# Patient Record
Sex: Female | Born: 1980 | Race: White | Hispanic: No | Marital: Married | Smoking: Never smoker
Health system: Southern US, Community
[De-identification: ages and names within clinical notes are randomized; demographics above are authoritative.]

## PROBLEM LIST (undated history)

## (undated) DIAGNOSIS — R569 Unspecified convulsions: Secondary | ICD-10-CM

## (undated) HISTORY — PX: TUBAL LIGATION: SHX77

## (undated) HISTORY — PX: CRANIOTOMY: SHX93

## (undated) HISTORY — DX: Unspecified convulsions: R56.9

## (undated) HISTORY — PX: HIP ARTHROSCOPY: SUR88

## (undated) HISTORY — PX: WISDOM TOOTH EXTRACTION: SHX21

---

## 2019-11-20 ENCOUNTER — Encounter: Payer: Self-pay | Admitting: Dermatology

## 2019-11-20 ENCOUNTER — Ambulatory Visit (INDEPENDENT_AMBULATORY_CARE_PROVIDER_SITE_OTHER): Payer: BC Managed Care – PPO | Admitting: Dermatology

## 2019-11-20 ENCOUNTER — Other Ambulatory Visit: Payer: Self-pay

## 2019-11-20 DIAGNOSIS — L659 Nonscarring hair loss, unspecified: Secondary | ICD-10-CM

## 2019-11-20 NOTE — Patient Instructions (Signed)
Alexandra Henderson returns for recheck of her hair loss.  Hair plug is now completely normal indicating that if there was telogen shedding it has self corrected.  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  She continues to use the minoxidil foam but the prescription phone became exorbitantly expensive so this was discontinued.  She is scheduled to see Dr. Lamount Cohen next month, and I will agreed to refill finasteride if Dr. Lamount Cohen prescribes this for Emy.  Additionally she has noticed a somewhat painful bump on her left lower back; examination showed a slightly swollen tan 3 mm papule which is likely an irritated keratosis.  I will recheck the spot when I do a general skin examination in the near future.

## 2019-11-20 NOTE — Progress Notes (Signed)
Patient reminded she has a visit date 12/19/2019 at 3:00with [facility] Dermatology Department With Dr Bernadene Bell.

## 2019-11-21 NOTE — Progress Notes (Signed)
   Follow-Up Visit   Subjective  Ceci Ravens is a 39 y.o. female who presents for the following: Follow-up (3 month follow up for Hair loss. Patient states that she's noticed that her hair isn't falling out as much.  Pt states that she did use the Clobetasol Foam for 1 month but had to stop due to the cost of the foam.  Patient has been using the OTC Rogaine.). Hair loss Location: scalp Duration: 1+ year Quality: a bit better Associated Signs/Symptoms: Modifying Factors: topical minoxidil; stopped clobetasol b/o cost Severity:  Timing: Context:   The following portions of the chart were reviewed this encounter and updated as appropriate:     Objective  Well appearing patient in no apparent distress; mood and affect are within normal limits.  A focused examination was performed including scalp, face, nails, neck. Relevant physical exam findings are noted in the Assessment and Plan. Berdene returns for recheck of her hair loss.  Hair plug is now completely normal indicating that if there was telogen shedding it has self corrected.  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  She continues to use the minoxidil foam but the prescription phone became exorbitantly expensive so this was discontinued.  She is scheduled to see Dr. Lamount Cohen next month, and I will agreed to refill finasteride if Dr. Lamount Cohen prescribes this for Lanisa.  Additionally she has noticed a somewhat painful bump on her left lower back; examination showed a slightly swollen tan 3 mm papule which is likely an irritated keratosis.  I will recheck the spot when I do a general skin examination in the near future.  Assessment & Plan  Nonscarring hair loss Mid Frontal Scalp  Continue minoxidil, keep appt with Dr. Lamount Cohen next month

## 2019-12-25 ENCOUNTER — Other Ambulatory Visit: Payer: Self-pay

## 2019-12-25 ENCOUNTER — Emergency Department (HOSPITAL_COMMUNITY): Payer: BC Managed Care – PPO

## 2019-12-25 ENCOUNTER — Ambulatory Visit: Payer: BC Managed Care – PPO | Admitting: Dermatology

## 2019-12-25 ENCOUNTER — Emergency Department (HOSPITAL_COMMUNITY)
Admission: EM | Admit: 2019-12-25 | Discharge: 2019-12-25 | Disposition: A | Discharge status: Home / Self Care | Payer: BC Managed Care – PPO | Attending: Emergency Medicine | Admitting: Emergency Medicine

## 2019-12-25 ENCOUNTER — Encounter (HOSPITAL_COMMUNITY): Payer: Self-pay

## 2019-12-25 DIAGNOSIS — R569 Unspecified convulsions: Secondary | ICD-10-CM | POA: Insufficient documentation

## 2019-12-25 DIAGNOSIS — D496 Neoplasm of unspecified behavior of brain: Secondary | ICD-10-CM

## 2019-12-25 DIAGNOSIS — R110 Nausea: Secondary | ICD-10-CM | POA: Diagnosis not present

## 2019-12-25 DIAGNOSIS — R519 Headache, unspecified: Secondary | ICD-10-CM | POA: Insufficient documentation

## 2019-12-25 LAB — COMPREHENSIVE METABOLIC PANEL
ALT: 30 U/L (ref 0–44)
AST: 25 U/L (ref 15–41)
Albumin: 3.3 g/dL — ABNORMAL LOW (ref 3.5–5.0)
Alkaline Phosphatase: 47 U/L (ref 38–126)
Anion gap: 11 (ref 5–15)
BUN: 16 mg/dL (ref 6–20)
CO2: 21 mmol/L — ABNORMAL LOW (ref 22–32)
Calcium: 8.5 mg/dL — ABNORMAL LOW (ref 8.9–10.3)
Chloride: 108 mmol/L (ref 98–111)
Creatinine, Ser: 0.72 mg/dL (ref 0.44–1.00)
GFR calc Af Amer: >60 mL/min (ref >60)
GFR calc non Af Amer: >60 mL/min (ref >60)
Glucose, Bld: 106 mg/dL — ABNORMAL HIGH (ref 70–99)
Potassium: 3.7 mmol/L (ref 3.5–5.1)
Sodium: 140 mmol/L (ref 135–145)
Total Bilirubin: 0.5 mg/dL (ref 0.3–1.2)
Total Protein: 6 g/dL — ABNORMAL LOW (ref 6.5–8.1)

## 2019-12-25 LAB — I-STAT BETA HCG BLOOD, ED (MC, WL, AP ONLY)
I-stat hCG, quantitative: <5 m[IU]/mL (ref <5)
I-stat hCG, quantitative: <5 m[IU]/mL (ref <5)

## 2019-12-25 LAB — URINALYSIS, ROUTINE W REFLEX MICROSCOPIC
Bilirubin Urine: NEGATIVE
Glucose, UA: NEGATIVE mg/dL
Hgb urine dipstick: NEGATIVE
Ketones, ur: NEGATIVE mg/dL
Leukocytes,Ua: NEGATIVE
Nitrite: NEGATIVE
Protein, ur: NEGATIVE mg/dL
Specific Gravity, Urine: 1.009 (ref 1.005–1.030)
pH: 7 (ref 5.0–8.0)

## 2019-12-25 LAB — CBC
HCT: 37.5 % (ref 36.0–46.0)
Hemoglobin: 12.1 g/dL (ref 12.0–15.0)
MCH: 30.3 pg (ref 26.0–34.0)
MCHC: 32.3 g/dL (ref 30.0–36.0)
MCV: 93.8 fL (ref 80.0–100.0)
Platelets: 189 10*3/uL (ref 150–400)
RBC: 4 MIL/uL (ref 3.87–5.11)
RDW: 12 % (ref 11.5–15.5)
WBC: 6.2 10*3/uL (ref 4.0–10.5)
nRBC: 0 % (ref 0.0–0.2)

## 2019-12-25 LAB — CBG MONITORING, ED: Glucose-Capillary: 92 mg/dL (ref 70–99)

## 2019-12-25 MED ORDER — LAMOTRIGINE 150 MG PO TABS
150.0000 mg | ORAL_TABLET | Freq: Once | ORAL | Status: AC
  Administered 2019-12-25: 07:00:00 150 mg via ORAL
  Filled 2019-12-25: qty 1

## 2019-12-25 MED ORDER — LAMOTRIGINE 150 MG PO TABS: 150.0000 mg | ORAL_TABLET | Freq: Every day | ORAL | 1 refills | Status: DC

## 2019-12-25 MED ORDER — ONDANSETRON HCL 4 MG/2ML IJ SOLN
4.0000 mg | Freq: Once | INTRAMUSCULAR | Status: AC
  Administered 2019-12-25: 07:00:00 4 mg via INTRAVENOUS
  Filled 2019-12-25: qty 2

## 2019-12-25 MED ORDER — SODIUM CHLORIDE 0.9 % IV BOLUS
1000.0000 mL | Freq: Once | INTRAVENOUS | Status: AC
  Administered 2019-12-25: 1000 mL via INTRAVENOUS

## 2019-12-25 MED ORDER — LEVETIRACETAM IN NACL 1000 MG/100ML IV SOLN
1000.0000 mg | Freq: Once | INTRAVENOUS | Status: DC
  Filled 2019-12-25: qty 100

## 2019-12-25 MED ORDER — GADOBUTROL 1 MMOL/ML IV SOLN
7.0000 mL | Freq: Once | INTRAVENOUS | Status: AC | PRN
  Administered 2019-12-25: 7 mL via INTRAVENOUS

## 2019-12-25 NOTE — Consult Note (Signed)
Reason for Consult:Brain mass, seizure Referring Physician: [name] is an 39 y.o. female.  HPI: whom seized this am and found on CT to have a lesion in the left frontal area.  History reviewed. No pertinent past medical history.  History reviewed. No pertinent surgical history.  History reviewed. No pertinent family history.  Social History:  reports that she has never smoked. She has never used smokeless tobacco. She reports previous alcohol use. She reports that she does not use drugs.  Allergies:  Allergies  Allergen Reactions  . Penicillins Hives    Medications: I have reviewed the patient's current medications.  Results for orders placed or performed during the hospital encounter of 12/25/19 (from the past 48 hour(s))  CBC - if new onset seizures     Status: None   Collection Time: 12/25/19  4:28 AM  Result Value Ref Range   WBC 6.2 4.0 - 10.5 K/uL   RBC 4.00 3.87 - 5.11 MIL/uL   Hemoglobin 12.1 12.0 - 15.0 g/dL   HCT 37.5 36.0 - 46.0 %   MCV 93.8 80.0 - 100.0 fL   MCH 30.3 26.0 - 34.0 pg   MCHC 32.3 30.0 - 36.0 g/dL   RDW 12.0 11.5 - 15.5 %   Platelets 189 150 - 400 K/uL   nRBC 0.0 0.0 - 0.2 %    Comment: Performed at [facility], [place] [address], [place], [place] [address]  Comprehensive metabolic panel     Status: Abnormal   Collection Time: 12/25/19  4:28 AM  Result Value Ref Range   Sodium 140 135 - 145 mmol/L   Potassium 3.7 3.5 - 5.1 mmol/L   Chloride 108 98 - 111 mmol/L   CO2 21 (L) 22 - 32 mmol/L   Glucose, Bld 106 (H) 70 - 99 mg/dL    Comment: Glucose reference range applies only to samples taken after fasting for at least 8 hours.   BUN 16 6 - 20 mg/dL   Creatinine, Ser 0.72 0.44 - 1.00 mg/dL   Calcium 8.5 (L) 8.9 - 10.3 mg/dL   Total Protein 6.0 (L) 6.5 - 8.1 g/dL   Albumin 3.3 (L) 3.5 - 5.0 g/dL   AST 25 15 - 41 U/L   ALT 30 0 - 44 U/L   Alkaline Phosphatase 47 38 - 126 U/L   Total Bilirubin 0.5 0.3 - 1.2 mg/dL   GFR calc  non Af Amer >60 >60 mL/min   GFR calc Af Amer >60 >60 mL/min   Anion gap 11 5 - 15    Comment: Performed at [facility] [address], [place], [place] [address]  CBG monitoring, ED     Status: None   Collection Time: 12/25/19  4:33 AM  Result Value Ref Range   Glucose-Capillary 92 70 - 99 mg/dL    Comment: Glucose reference range applies only to samples taken after fasting for at least 8 hours.  I-Stat beta hCG blood, ED     Status: None   Collection Time: 12/25/19  4:45 AM  Result Value Ref Range   I-stat hCG, quantitative <5.0 <5 mIU/mL   Comment 3            Comment:   GEST. AGE      CONC.  (mIU/mL)   <=1 WEEK        5 - 50     2 WEEKS       50 - 500  3 WEEKS       100 - 10,000     4 WEEKS     1,000 - 30,000        FEMALE AND NON-PREGNANT FEMALE:     LESS THAN 5 mIU/mL   I-Stat beta hCG blood, ED     Status: None   Collection Time: 12/25/19  7:12 AM  Result Value Ref Range   I-stat hCG, quantitative <5.0 <5 mIU/mL   Comment 3            Comment:   GEST. AGE      CONC.  (mIU/mL)   <=1 WEEK        5 - 50     2 WEEKS       50 - 500     3 WEEKS       100 - 10,000     4 WEEKS     1,000 - 30,000        FEMALE AND NON-PREGNANT FEMALE:     LESS THAN 5 mIU/mL   Urinalysis, Routine w reflex microscopic     Status: Abnormal   Collection Time: 12/25/19  9:00 AM  Result Value Ref Range   Color, Urine STRAW (A) YELLOW   APPearance CLEAR CLEAR   Specific Gravity, Urine 1.009 1.005 - 1.030   pH 7.0 5.0 - 8.0   Glucose, UA NEGATIVE NEGATIVE mg/dL   Hgb urine dipstick NEGATIVE NEGATIVE   Bilirubin Urine NEGATIVE NEGATIVE   Ketones, ur NEGATIVE NEGATIVE mg/dL   Protein, ur NEGATIVE NEGATIVE mg/dL   Nitrite NEGATIVE NEGATIVE   Leukocytes,Ua NEGATIVE NEGATIVE    Comment: Performed at [facility], [place] [address] [place],  [address]    CT Head Wo Contrast  Result Date: 12/25/2019 CLINICAL DATA:  Seizure EXAM: CT HEAD WITHOUT CONTRAST TECHNIQUE: Contiguous  axial images were obtained from the base of the skull through the vertex without intravenous contrast. COMPARISON:  Seizure, abnormal neuro exam FINDINGS: Brain: There is a large hypodense area seen within the left frontal lobe with a rounded coarse calcifications seen within it. No midline shift or extra-axial collections are seen. Normal gray-white differentiation. Ventricles are normal in size and contour. Vascular: No hyperdense vessel or unexpected calcification. Skull: The skull is intact. No fracture or focal lesion identified. Sinuses/Orbits: The visualized paranasal sinuses and mastoid air cells are clear. The orbits and globes intact. Other: None IMPRESSION: Large left frontal lobe lesion with coarse internal calcifications which could be due to prior infection, infectious process, and/or neoplasm. Electronically Signed   By: Prudencio Pair M.D.   On: 12/25/2019 05:15   MR Brain W and Wo Contrast  Result Date: 12/25/2019 CLINICAL DATA:  Seizure with abnormal neuro exam EXAM: MRI HEAD WITHOUT AND WITH CONTRAST TECHNIQUE: Multiplanar, multiecho pulse sequences of the brain and surrounding structures were obtained without and with intravenous contrast. CONTRAST:  11mL GADAVIST GADOBUTROL 1 MMOL/ML IV SOLN COMPARISON:  Head CT earlier today FINDINGS: Brain: Heterogeneous mass in the left frontal lobe which has an intra-axial appearance posteriorly and is bulging from the cortex anteriorly. There are prominent superficial vessels, dystrophic appearing calcification, and patchy stippled enhancement. This mass measures up to 5 cm. There is a separate area of infiltrative cortically based T2 hyperintensity with gyral thickening in the frontal pole on the right, measuring up to 2.4 cm, without enhancement. No intervening signal abnormality. Elsewhere the brain appears normal with no infarct, hemorrhage, hydrocephalus, or collection. Vascular: Normal flow  voids and vascular enhancements Skull and upper cervical  spine: Negative Sinuses/Orbits: Negative IMPRESSION: 1. 5 cm anterior left frontal mass with calcification and stippled enhancement. Neoplasm, especially oligodendroglioma, is favored. 2. There is a smaller area of infiltrative, nonenhancing signal abnormality in the anterior right frontal lobe. 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 Electronically Signed   By: Monte Fantasia M.D.   On: 12/25/2019 08:18    Review of Systems  Constitutional: Negative.   HENT: Negative.   Eyes: Negative.   Respiratory: Negative.   Cardiovascular: Negative.   Gastrointestinal: Negative.   Endocrine: Negative.   Genitourinary: Negative.   Musculoskeletal: Negative.   Skin: Negative.   Allergic/Immunologic: Negative.   Neurological: Positive for speech difficulty.  Hematological: Negative.   Psychiatric/Behavioral:       Anxiety   Blood pressure 98/66, pulse 74, temperature 97.9 F (36.6 C), temperature source Oral, resp. rate 16, SpO2 98 %. Physical Exam  Constitutional: She is oriented to person, place, and time. She appears well-developed and well-nourished. No distress.  HENT:  Head: Normocephalic and atraumatic.  Right Ear: External ear normal.  Left Ear: External ear normal.  Nose: Nose normal.  Mouth/Throat: Oropharynx is clear and moist.  Eyes: Pupils are equal, round, and reactive to light. Conjunctivae and EOM are normal.  Cardiovascular: Normal rate, regular rhythm, normal heart sounds and intact distal pulses.  Respiratory: Effort normal and breath sounds normal.  GI: Soft. Bowel sounds are normal.  Musculoskeletal:        General: Normal range of motion.     Cervical back: Normal range of motion and neck supple.  Neurological: She is alert and oriented to person, place, and time. She has normal reflexes. She displays normal reflexes. No cranial nerve deficit. She exhibits normal muscle tone.  Coordination normal.  No drift, normal proprioception  Skin: Skin is warm and dry.  Psychiatric: She has a normal mood and affect. Her behavior is normal. Judgment and thought content normal.    Assessment/Plan: Alexandra Henderson is a 39 y.o. female Whom had her first seizure this am approximately 0231 while asleep. Head CT and subsequent MRI revealed a probable extra axial mass poorly enhancing heavily calcified in the left frontal region. She also has a T2 hyperdense gyrus in right frontal lobe. The seizure may have been because she had not taken her Lamictal for three days. I however, given the mass on the left have recommended resection of the mass. I will plan on next week and have spoken to Mrs. Frazzini and her husband in the ED today. Risks including but not limited to stroke, coma, death, bleeding, infection, brain damage, change in personality, change in speech function, weakness, non diagnostic sampling. She understands and will wait for my office to contact her.   Ashok Pall 12/25/2019, 10:22 AM

## 2019-12-25 NOTE — ED Notes (Signed)
Pt transported to CT ?

## 2019-12-25 NOTE — ED Provider Notes (Signed)
[facility] EMERGENCY DEPARTMENT Provider Note   CSN: [id number] Arrival date & time: 12/25/19  [id number]     History Chief Complaint  Patient presents with  . Seizures    Alexandra Henderson is a 39 y.o. female with history of anxiety who presents to the emergency department by EMS with a chief complaint of seizure.  The patient had a witnessed seizure by her significant other with a full body shaking and jerking that lasted for approximately 1-2 minutes prior to arriving.  Seizure activity has subsided by the time the EMS was on scene.  Trauma was noted to the tip in the bilateral sides of the patient's tongue.  The patient was noted to be drowsy during transport.  EMS reports that the patient's blood pressure was 90 over 60s in route with minimal improvement after receiving a 750 cc fluid bolus.  The patient's husband reports that he awoke to the patient having full body shaking and jerking with her bilateral arms clenched.  He notes that the seizure lasted for approximately 1 minute.  She seemed very drowsy and had decreased responsiveness for the next 10 to 15 minutes after the episode ended.  She did not fall out of bed or hit her head.  He notes that earlier in the evening the patient was complaining of a severe headache.  In the ER, she is endorsing a left-sided headache and nausea.  Her left leg is feeling weak and heavy.  No chest pain, shortness of breath, visual changes, slurred speech, numbness, dizziness, or lightheadedness.  The patient has been taking Lamictal for anxiety for several months, but has been out of the medication for the last 3 days.  She has been compliant with her home Lexapro.  No history of seizures.  She is a non-smoker.  She denies illicit or recreational substance use.  Denies alcohol use.  The history is provided by the patient. No language interpreter was used.       History reviewed. No pertinent past medical history.  There are no  problems to display for this patient.   History reviewed. No pertinent surgical history.   OB History   No obstetric history on file.     History reviewed. No pertinent family history.  Social History   Tobacco Use  . Smoking status: Never Smoker  . Smokeless tobacco: Never Used  Substance Use Topics  . Alcohol use: Not Currently  . Drug use: Never    Home Medications Prior to Admission medications   Medication Sig Start Date End Date Taking? Authorizing Provider  escitalopram (LEXAPRO) 20 MG tablet Take 20 mg by mouth daily. 11/05/19  Yes [provider]  Multiple Vitamin (MULTIVITAMIN WITH MINERALS) TABS tablet Take 1 tablet by mouth daily.   Yes [provider]  lamoTRIgine (LAMICTAL) 150 MG tablet Take 1 tablet (150 mg total) by mouth daily. 12/25/19   Aashritha Miedema A, PA-C    Allergies    Penicillins  Review of Systems   Review of Systems  Constitutional: Negative for activity change, chills, diaphoresis and fever.  HENT: Negative for congestion.   Respiratory: Negative for shortness of breath.   Cardiovascular: Negative for chest pain and palpitations.  Gastrointestinal: Positive for nausea. Negative for abdominal pain, blood in stool, diarrhea and vomiting.  Genitourinary: Negative for dysuria, vaginal bleeding, vaginal discharge and vaginal pain.  Musculoskeletal: Negative for back pain, myalgias, neck pain and neck stiffness.  Skin: Negative for rash.  Allergic/Immunologic: Negative for immunocompromised  state.  Neurological: Positive for seizures, weakness and headaches. Negative for dizziness, syncope, light-headedness and numbness.  Psychiatric/Behavioral: Negative for confusion.   Physical Exam Updated Vital Signs BP 97/64   Pulse 62   Temp 97.9 F (36.6 C) (Oral)   Resp 14   SpO2 98%   Physical Exam Vitals and nursing note reviewed.  Constitutional:      General: She is not in acute distress. HENT:     Head: Normocephalic.      Mouth/Throat:      Comments: Superficial abrasions noted to the tip into the bilateral anterior tongue.  No lacerations. Eyes:     Extraocular Movements: Extraocular movements intact.     Conjunctiva/sclera: Conjunctivae normal.     Pupils: Pupils are equal, round, and reactive to light.  Cardiovascular:     Rate and Rhythm: Normal rate and regular rhythm.     Heart sounds: No murmur. No friction rub. No gallop.   Pulmonary:     Effort: Pulmonary effort is normal. No respiratory distress.     Breath sounds: No stridor. No wheezing, rhonchi or rales.  Chest:     Chest wall: No tenderness.  Abdominal:     General: There is no distension.     Palpations: Abdomen is soft. There is no mass.     Tenderness: There is no abdominal tenderness. There is no right CVA tenderness, left CVA tenderness, guarding or rebound.     Hernia: No hernia is present.  Musculoskeletal:     Cervical back: Neck supple.     Right lower leg: No edema.     Left lower leg: No edema.  Skin:    General: Skin is warm.     Coloration: Skin is not jaundiced or pale.     Findings: No rash.  Neurological:     Mental Status: She is alert.     Comments: Alert and oriented x4.  Cranial nerves II through XII are grossly intact.  4 out of 5 strength against resistance of the left lower extremity compared to the right.  5/5 strength against resistance of the bilateral upper extremities.  Sensation is intact and equal throughout.  Dysmetria noted bilaterally.  No pronator drift.  Gait exam deferred at this time.  Psychiatric:        Behavior: Behavior normal.     ED Results / Procedures / Treatments   Labs (all labs ordered are listed, but only abnormal results are displayed) Labs Reviewed  COMPREHENSIVE METABOLIC PANEL - Abnormal; Notable for the following components:      Result Value   CO2 21 (*)    Glucose, Bld 106 (*)    Calcium 8.5 (*)    Total Protein 6.0 (*)    Albumin 3.3 (*)    All other components within  normal limits  CBC  LAMOTRIGINE LEVEL  URINALYSIS, ROUTINE W REFLEX MICROSCOPIC  CBG MONITORING, ED  I-STAT BETA HCG BLOOD, ED ([facility], [facility], [facility] ONLY)  I-STAT BETA HCG BLOOD, ED ([facility], [facility], [facility] ONLY)    EKG EKG Interpretation  Date/Time:  Wednesday December 25 2019 04:24:22 EDT Ventricular Rate:  67 PR Interval:    QRS Duration: 88 QT Interval:  423 QTC Calculation: 447 R Axis:   52 Text Interpretation: Sinus rhythm Normal ECG Confirmed by Orpah Greek [phone]) on 12/25/2019 4:29:22 AM   Radiology CT Head Wo Contrast  Result Date: 12/25/2019 CLINICAL DATA:  Seizure EXAM: CT HEAD WITHOUT CONTRAST TECHNIQUE: Contiguous axial images were  obtained from the base of the skull through the vertex without intravenous contrast. COMPARISON:  Seizure, abnormal neuro exam FINDINGS: Brain: There is a large hypodense area seen within the left frontal lobe with a rounded coarse calcifications seen within it. No midline shift or extra-axial collections are seen. Normal gray-white differentiation. Ventricles are normal in size and contour. Vascular: No hyperdense vessel or unexpected calcification. Skull: The skull is intact. No fracture or focal lesion identified. Sinuses/Orbits: The visualized paranasal sinuses and mastoid air cells are clear. The orbits and globes intact. Other: None IMPRESSION: Large left frontal lobe lesion with coarse internal calcifications which could be due to prior infection, infectious process, and/or neoplasm. Electronically Signed   By: Prudencio Pair M.D.   On: 12/25/2019 05:15    Procedures Procedures (including critical care time)  Medications Ordered in ED Medications  lamoTRIgine (LAMICTAL) tablet 150 mg (has no administration in time range)  sodium chloride 0.9 % bolus 1,000 mL (1,000 mLs Intravenous Bolus from Bag 12/25/19 0710)  ondansetron (ZOFRAN) injection 4 mg (4 mg Intravenous Given 12/25/19 0710)    ED Course  I have reviewed the triage vital signs and the  nursing notes.  Pertinent labs & imaging results that were available during my care of the patient were reviewed by me and considered in my medical decision making (see chart for details).    MDM Rules/Calculators/A&P                      39 year old female brought in by EMS after witnessed seizure-like activity lasted for approximately 85minute and a night.  Patient has had a persistent severe left-sided headache for most of the evening and is now endorsing nausea.  The patient has been on Lamictal for several months and has not taken the medication at home for the last 3 days.  Suspect this may be contributing to seizure tonight.  The patient has no history of seizure disorder or epilepsy.  She denies any illicit substance use.  No recent alcohol use.  On exam, she does have some left leg weakness.  Sensation is intact throughout.  Cranial nerves II through XII are grossly intact.   No significant electrolyte derangements noted on labs.  EKG with normal sinus rhythm.  Pregnancy test is negative.  CT head demonstrating a large left frontal lobe lesion with coarse internal calcifications.  This appears to be chronic and is concerning for prior infection, infectious process, and/or neoplasm.  Discussed the patient with Dr. Leonel Ramsay.  He has seen cases in which patients have been started on Lamictal for other reasons aside from epilepsy and have had withdrawal seizures if the medication has been stopped.  He recommends continuing the patient on Lamictal and giving her 150 mg dose in the ER.  We also discussed the lesion noted on head CT.  He recommends MR brain with and without for further evaluation of the lesion to assist with outpatient follow-up.  MRI brain is pending.  On repeat evaluation, the patient is out of her home Lamictal.  We will provide her with a refill of the medication.  She is continuing to endorse some mild heaviness in the left leg, but her strength is now 5-5 to the bilateral  lower extremities, improved from previous.  She will need to be ambulated prior to discharge.  She will need to be given seizure and driving precautions for 6 months.  Patient care transferred to [facility] at the end of my shift.  Patient presentation, ED course, and plan of care discussed with review of all pertinent labs and imaging. Please see his/her note for further details regarding further ED course and disposition.   Final Clinical Impression(s) / ED Diagnoses Final diagnoses:  None    Rx / DC Orders ED Discharge Orders         Ordered    lamoTRIgine (LAMICTAL) 150 MG tablet  Daily     12/25/19 0709           Joanne Gavel, PA-C 12/25/19 [id number]    Orpah Greek, MD 12/29/19 [phone]

## 2019-12-25 NOTE — Discharge Instructions (Addendum)
Thank you for allowing me to care for you today in the Emergency Department.   I have given you a refill of your home Lamictal.  Since you had a seizure today, no driving for 6 months.  You need to be cleared by neurology before being allowed to operate a motor vehicle again.  Call to schedule follow up with Dr. Christella Noa   Return to the ED if you have any additional seizures

## 2019-12-25 NOTE — ED Triage Notes (Signed)
Pt BIB [facility] from home c/o of a witness 1-2 min granmal seizure. Pt has obvious tongue trauma to the tip of the tongue and both sides. Pt states the only change is that she has been out of her Lamictal for 3 days. Pt states she was also outside working today as well. Pt states she does not remember what happened but does not have a history of seizure and is a pretty healthy lady.

## 2019-12-25 NOTE — ED Notes (Signed)
Patient Alert and oriented to baseline. Stable and ambulatory to baseline. Patient verbalized understanding of the discharge instructions.  Patient belongings were taken by the patient.   

## 2019-12-25 NOTE — ED Provider Notes (Signed)
Care assumed from [place], please see her note for full details, but in brief Alexandra Henderson is a 39 y.o. female with a history of anxiety who presented with first-time witnessed seizure that lasted 1 to 2 minutes.  Seizure had resolved by the time EMS arrived patient was postictal for the next 10 to 15 minutes.  She was complaining of a severe headache earlier that evening.  And reports continued left-sided headache, nausea and feeling of left leg heaviness and weakness on arrival.  No further seizure activity while monitored here in the ED. 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  Case was discussed with Dr. Leonel Ramsay with neurology who has seen withdrawal seizures in people who abruptly stop Lamictal for mood stabilization.  CT of the head also demonstrates a large left frontal lobe mass with coarse internal calcifications.  Radiology reports this is concerning for potential infection versus neoplasm.  Dr. Leonel Ramsay with neurology recommended MRI for better characterization.  Patient given loading Keppra dose and will need to be restarted on her home Keppra.  Plan: Follow-up on MRI results, reassess left lower extremity weakness, patient will need to ambulate, anticipate discharge with outpatient follow-up.  BP 97/64   Pulse 62   Temp 97.9 F (36.6 C) (Oral)   Resp 14   SpO2 98%    ED Course/Procedures   Labs Reviewed  COMPREHENSIVE METABOLIC PANEL - Abnormal; Notable for the following components:      Result Value   CO2 21 (*)    Glucose, Bld 106 (*)    Calcium 8.5 (*)    Total Protein 6.0 (*)    Albumin 3.3 (*)    All other components within normal limits  CBC  LAMOTRIGINE LEVEL  URINALYSIS, ROUTINE W REFLEX MICROSCOPIC  CBG MONITORING, ED  I-STAT BETA HCG BLOOD, ED ([facility], [facility], [facility] ONLY)  I-STAT BETA HCG BLOOD, ED ([facility], [facility], [facility] ONLY)   CT Head Wo Contrast  Result Date: 12/25/2019 CLINICAL DATA:   Seizure EXAM: CT HEAD WITHOUT CONTRAST TECHNIQUE: Contiguous axial images were obtained from the base of the skull through the vertex without intravenous contrast. COMPARISON:  Seizure, abnormal neuro exam FINDINGS: Brain: There is a large hypodense area seen within the left frontal lobe with a rounded coarse calcifications seen within it. No midline shift or extra-axial collections are seen. Normal gray-white differentiation. Ventricles are normal in size and contour. Vascular: No hyperdense vessel or unexpected calcification. Skull: The skull is intact. No fracture or focal lesion identified. Sinuses/Orbits: The visualized paranasal sinuses and mastoid air cells are clear. The orbits and globes intact. Other: None IMPRESSION: Large left frontal lobe lesion with coarse internal calcifications which could be due to prior infection, infectious process, and/or neoplasm. Electronically Signed   By: Prudencio Pair M.D.   On: 12/25/2019 05:15   MR Brain W and Wo Contrast  Result Date: 12/25/2019 CLINICAL DATA:  Seizure with abnormal neuro exam EXAM: MRI HEAD WITHOUT AND WITH CONTRAST TECHNIQUE: Multiplanar, multiecho pulse sequences of the brain and surrounding structures were obtained without and with intravenous contrast. CONTRAST:  48mL GADAVIST GADOBUTROL 1 MMOL/ML IV SOLN COMPARISON:  Head CT earlier today FINDINGS: Brain: Heterogeneous mass in the left frontal lobe which has an intra-axial appearance posteriorly and is bulging from the cortex anteriorly. There are prominent superficial vessels, dystrophic appearing calcification, and patchy stippled enhancement. This mass measures up to 5 cm. There  is a separate area of infiltrative cortically based T2 hyperintensity with gyral thickening in the frontal pole on the right, measuring up to 2.4 cm, without enhancement. No intervening signal abnormality. Elsewhere the brain appears normal with no infarct, hemorrhage, hydrocephalus, or collection. Vascular: Normal flow  voids and vascular enhancements Skull and upper cervical spine: Negative Sinuses/Orbits: Negative IMPRESSION: 1. 5 cm anterior left frontal mass with calcification and stippled enhancement. Neoplasm, especially oligodendroglioma, is favored. 2. There is a smaller area of infiltrative, nonenhancing signal abnormality in the anterior right frontal lobe. 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 Electronically Signed   By: Monte Fantasia M.D.   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  I discussed these results with the patient and her husband.  Will consult neurosurgery for further recommendations.  Case discussed with Dr. Christella Noa with neurosurgery who will come to see the patient in the ED shortly.  Dr. Christella Noa has seen and evaluated patient in the ED, feel she is stable for discharge and outpatient follow-up, with plans to schedule surgery for tumor removal.  Neurology did not feel that patient needed additional seizure medication at this time and she has been monitored here in the ED for 6 hours with no further seizure activity.  She has had complete resolution of left lower extremity weakness and has been able to ambulate here in the ED.  I discussed strict return precautions for the patient and gave her seizure precautions, no driving for 6 months.  Patient and husband expressed understanding and agreement with plan.  Discharged home in good condition.  Final diagnoses:  Seizure [facility])  Brain tumor [facility])        Jacqlyn Larsen, PA-C 12/25/19 1022    Quintella Reichert, MD 12/26/19 [phone]

## 2019-12-25 NOTE — ED Notes (Signed)
Pt ambulatory with no assistance with steady gait. Pt reports no left leg weakness/numbness while ambulating. Pt resting in bed at this time.

## 2019-12-25 NOTE — ED Notes (Signed)
Pt transported to MRI 

## 2019-12-26 ENCOUNTER — Telehealth: Payer: Self-pay | Admitting: *Deleted

## 2019-12-26 ENCOUNTER — Other Ambulatory Visit: Payer: Self-pay | Admitting: Radiation Therapy

## 2019-12-26 DIAGNOSIS — G939 Disorder of brain, unspecified: Secondary | ICD-10-CM | POA: Insufficient documentation

## 2019-12-26 LAB — LAMOTRIGINE LEVEL: Lamotrigine Lvl: <1 ug/mL — ABNORMAL LOW (ref 2.0–20.0)

## 2019-12-26 NOTE — Telephone Encounter (Signed)
Per referral attempted to contact patient to get scheduled to see Dr. Mickeal Skinner.  Patient stated that they had an appt today at [facility] and felt like they would seek care there.  Offered assistance in any way possible if that should change.

## 2019-12-30 ENCOUNTER — Inpatient Hospital Stay: Payer: BC Managed Care – PPO | Attending: Internal Medicine

## 2019-12-30 ENCOUNTER — Telehealth: Payer: Self-pay | Admitting: Internal Medicine

## 2019-12-30 NOTE — Telephone Encounter (Signed)
Received a new referral from the ED for seizures. I cld and left the pt a voicemail to call back and schedule an appt w/Dr. Mickeal Skinner.

## 2020-01-08 DIAGNOSIS — C719 Malignant neoplasm of brain, unspecified: Secondary | ICD-10-CM | POA: Insufficient documentation

## 2020-04-20 DIAGNOSIS — C711 Malignant neoplasm of frontal lobe: Secondary | ICD-10-CM | POA: Diagnosis not present

## 2020-04-20 DIAGNOSIS — G9389 Other specified disorders of brain: Secondary | ICD-10-CM | POA: Diagnosis not present

## 2020-04-20 DIAGNOSIS — Z9889 Other specified postprocedural states: Secondary | ICD-10-CM | POA: Diagnosis not present

## 2020-04-20 DIAGNOSIS — Z923 Personal history of irradiation: Secondary | ICD-10-CM | POA: Diagnosis not present

## 2020-04-24 DIAGNOSIS — C711 Malignant neoplasm of frontal lobe: Secondary | ICD-10-CM | POA: Diagnosis not present

## 2020-04-24 DIAGNOSIS — Z79899 Other long term (current) drug therapy: Secondary | ICD-10-CM | POA: Diagnosis not present

## 2020-04-24 DIAGNOSIS — C719 Malignant neoplasm of brain, unspecified: Secondary | ICD-10-CM | POA: Diagnosis not present

## 2020-05-18 DIAGNOSIS — Z5111 Encounter for antineoplastic chemotherapy: Secondary | ICD-10-CM | POA: Diagnosis not present

## 2020-05-18 DIAGNOSIS — C711 Malignant neoplasm of frontal lobe: Secondary | ICD-10-CM | POA: Diagnosis not present

## 2020-05-27 DIAGNOSIS — C719 Malignant neoplasm of brain, unspecified: Secondary | ICD-10-CM | POA: Diagnosis not present

## 2020-06-02 DIAGNOSIS — C719 Malignant neoplasm of brain, unspecified: Secondary | ICD-10-CM | POA: Diagnosis not present

## 2020-06-08 DIAGNOSIS — Z5111 Encounter for antineoplastic chemotherapy: Secondary | ICD-10-CM | POA: Diagnosis not present

## 2020-06-08 DIAGNOSIS — Z79899 Other long term (current) drug therapy: Secondary | ICD-10-CM | POA: Diagnosis not present

## 2020-06-08 DIAGNOSIS — C711 Malignant neoplasm of frontal lobe: Secondary | ICD-10-CM | POA: Diagnosis not present

## 2020-06-17 DIAGNOSIS — Z79899 Other long term (current) drug therapy: Secondary | ICD-10-CM | POA: Diagnosis not present

## 2020-06-17 DIAGNOSIS — Z9221 Personal history of antineoplastic chemotherapy: Secondary | ICD-10-CM | POA: Diagnosis not present

## 2020-06-17 DIAGNOSIS — R569 Unspecified convulsions: Secondary | ICD-10-CM | POA: Diagnosis not present

## 2020-06-17 DIAGNOSIS — C711 Malignant neoplasm of frontal lobe: Secondary | ICD-10-CM | POA: Diagnosis not present

## 2020-06-17 DIAGNOSIS — Z923 Personal history of irradiation: Secondary | ICD-10-CM | POA: Diagnosis not present

## 2020-06-22 DIAGNOSIS — C719 Malignant neoplasm of brain, unspecified: Secondary | ICD-10-CM | POA: Diagnosis not present

## 2020-06-22 DIAGNOSIS — Z79899 Other long term (current) drug therapy: Secondary | ICD-10-CM | POA: Diagnosis not present

## 2020-06-29 DIAGNOSIS — C711 Malignant neoplasm of frontal lobe: Secondary | ICD-10-CM | POA: Diagnosis not present

## 2020-06-29 DIAGNOSIS — Z5111 Encounter for antineoplastic chemotherapy: Secondary | ICD-10-CM | POA: Diagnosis not present

## 2020-07-10 DIAGNOSIS — C719 Malignant neoplasm of brain, unspecified: Secondary | ICD-10-CM | POA: Diagnosis not present

## 2020-07-17 DIAGNOSIS — C719 Malignant neoplasm of brain, unspecified: Secondary | ICD-10-CM | POA: Diagnosis not present

## 2020-07-20 DIAGNOSIS — C711 Malignant neoplasm of frontal lobe: Secondary | ICD-10-CM | POA: Diagnosis not present

## 2020-07-20 DIAGNOSIS — Z923 Personal history of irradiation: Secondary | ICD-10-CM | POA: Diagnosis not present

## 2020-07-20 DIAGNOSIS — Z5111 Encounter for antineoplastic chemotherapy: Secondary | ICD-10-CM | POA: Diagnosis not present

## 2020-07-20 DIAGNOSIS — Z79899 Other long term (current) drug therapy: Secondary | ICD-10-CM | POA: Diagnosis not present

## 2020-07-29 DIAGNOSIS — R519 Headache, unspecified: Secondary | ICD-10-CM | POA: Diagnosis not present

## 2020-07-29 DIAGNOSIS — C719 Malignant neoplasm of brain, unspecified: Secondary | ICD-10-CM | POA: Diagnosis not present

## 2020-07-29 DIAGNOSIS — K5900 Constipation, unspecified: Secondary | ICD-10-CM | POA: Diagnosis not present

## 2020-07-29 DIAGNOSIS — Z9889 Other specified postprocedural states: Secondary | ICD-10-CM | POA: Diagnosis not present

## 2020-07-29 DIAGNOSIS — Z95828 Presence of other vascular implants and grafts: Secondary | ICD-10-CM | POA: Diagnosis not present

## 2020-07-29 DIAGNOSIS — C711 Malignant neoplasm of frontal lobe: Secondary | ICD-10-CM | POA: Diagnosis not present

## 2020-08-03 DIAGNOSIS — C719 Malignant neoplasm of brain, unspecified: Secondary | ICD-10-CM | POA: Diagnosis not present

## 2020-08-03 DIAGNOSIS — Z5111 Encounter for antineoplastic chemotherapy: Secondary | ICD-10-CM | POA: Diagnosis not present

## 2020-08-05 DIAGNOSIS — C719 Malignant neoplasm of brain, unspecified: Secondary | ICD-10-CM | POA: Diagnosis not present

## 2020-08-12 DIAGNOSIS — Z5111 Encounter for antineoplastic chemotherapy: Secondary | ICD-10-CM | POA: Diagnosis not present

## 2020-08-12 DIAGNOSIS — C711 Malignant neoplasm of frontal lobe: Secondary | ICD-10-CM | POA: Diagnosis not present

## 2020-08-12 DIAGNOSIS — Z79899 Other long term (current) drug therapy: Secondary | ICD-10-CM | POA: Diagnosis not present

## 2020-08-17 DIAGNOSIS — C719 Malignant neoplasm of brain, unspecified: Secondary | ICD-10-CM | POA: Diagnosis not present

## 2020-08-26 DIAGNOSIS — C719 Malignant neoplasm of brain, unspecified: Secondary | ICD-10-CM | POA: Diagnosis not present

## 2020-09-02 DIAGNOSIS — C719 Malignant neoplasm of brain, unspecified: Secondary | ICD-10-CM | POA: Diagnosis not present

## 2020-09-09 DIAGNOSIS — C719 Malignant neoplasm of brain, unspecified: Secondary | ICD-10-CM | POA: Diagnosis not present

## 2020-09-09 DIAGNOSIS — R202 Paresthesia of skin: Secondary | ICD-10-CM | POA: Diagnosis not present

## 2020-09-09 DIAGNOSIS — R569 Unspecified convulsions: Secondary | ICD-10-CM | POA: Diagnosis not present

## 2020-09-09 DIAGNOSIS — Z923 Personal history of irradiation: Secondary | ICD-10-CM | POA: Diagnosis not present

## 2020-09-09 DIAGNOSIS — C711 Malignant neoplasm of frontal lobe: Secondary | ICD-10-CM | POA: Diagnosis not present

## 2020-09-16 DIAGNOSIS — C719 Malignant neoplasm of brain, unspecified: Secondary | ICD-10-CM | POA: Diagnosis not present

## 2020-09-24 DIAGNOSIS — C719 Malignant neoplasm of brain, unspecified: Secondary | ICD-10-CM | POA: Diagnosis not present

## 2020-10-15 DIAGNOSIS — C719 Malignant neoplasm of brain, unspecified: Secondary | ICD-10-CM | POA: Diagnosis not present

## 2020-10-20 DIAGNOSIS — C719 Malignant neoplasm of brain, unspecified: Secondary | ICD-10-CM | POA: Diagnosis not present

## 2020-10-29 DIAGNOSIS — C719 Malignant neoplasm of brain, unspecified: Secondary | ICD-10-CM | POA: Diagnosis not present

## 2020-11-05 DIAGNOSIS — C719 Malignant neoplasm of brain, unspecified: Secondary | ICD-10-CM | POA: Diagnosis not present

## 2020-11-09 DIAGNOSIS — C719 Malignant neoplasm of brain, unspecified: Secondary | ICD-10-CM | POA: Diagnosis not present

## 2020-11-11 DIAGNOSIS — Z79899 Other long term (current) drug therapy: Secondary | ICD-10-CM | POA: Diagnosis not present

## 2020-11-11 DIAGNOSIS — Z923 Personal history of irradiation: Secondary | ICD-10-CM | POA: Diagnosis not present

## 2020-11-11 DIAGNOSIS — G939 Disorder of brain, unspecified: Secondary | ICD-10-CM | POA: Diagnosis not present

## 2020-11-11 DIAGNOSIS — H538 Other visual disturbances: Secondary | ICD-10-CM | POA: Diagnosis not present

## 2020-11-11 DIAGNOSIS — C711 Malignant neoplasm of frontal lobe: Secondary | ICD-10-CM | POA: Diagnosis not present

## 2020-11-11 DIAGNOSIS — C719 Malignant neoplasm of brain, unspecified: Secondary | ICD-10-CM | POA: Diagnosis not present

## 2020-11-11 DIAGNOSIS — R519 Headache, unspecified: Secondary | ICD-10-CM | POA: Diagnosis not present

## 2020-11-11 DIAGNOSIS — R202 Paresthesia of skin: Secondary | ICD-10-CM | POA: Diagnosis not present

## 2020-11-15 DIAGNOSIS — C719 Malignant neoplasm of brain, unspecified: Secondary | ICD-10-CM | POA: Diagnosis not present

## 2020-11-16 DIAGNOSIS — C719 Malignant neoplasm of brain, unspecified: Secondary | ICD-10-CM | POA: Diagnosis not present

## 2020-11-23 DIAGNOSIS — C719 Malignant neoplasm of brain, unspecified: Secondary | ICD-10-CM | POA: Diagnosis not present

## 2020-11-30 DIAGNOSIS — C719 Malignant neoplasm of brain, unspecified: Secondary | ICD-10-CM | POA: Diagnosis not present

## 2020-12-08 DIAGNOSIS — C719 Malignant neoplasm of brain, unspecified: Secondary | ICD-10-CM | POA: Diagnosis not present

## 2021-01-01 IMAGING — CT CT HEAD W/O CM
4 series · 17 of 47 positions shown, 19 images · non-contrast
Comparison: Seizure, abnormal neuro exam

CLINICAL DATA: Seizure

EXAM:
CT HEAD WITHOUT CONTRAST
TECHNIQUE: Contiguous axial images were obtained from the base of the skull
through the vertex without intravenous contrast.

[Series 3: head wo · axial · 0.43mm/px · z∈[-80,+44]mm · 7 of 35 slices shown, 9 images]
[im 5/35  brain]
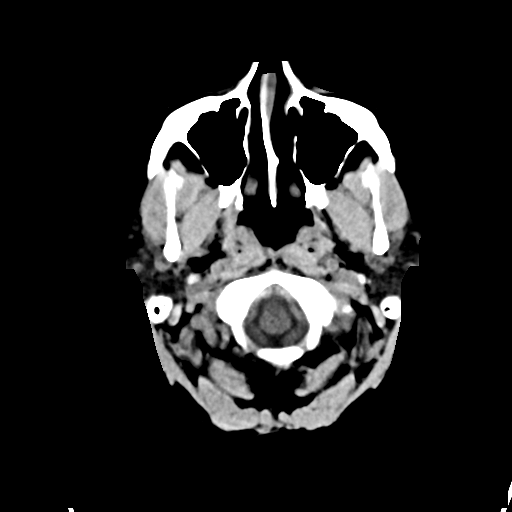
[im 5/35  bone]
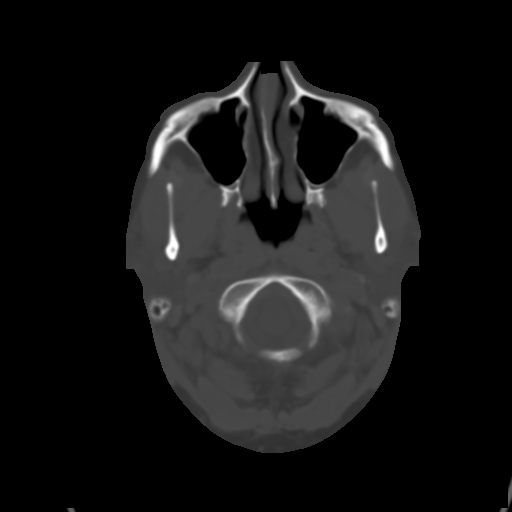
[im 9/35  brain]
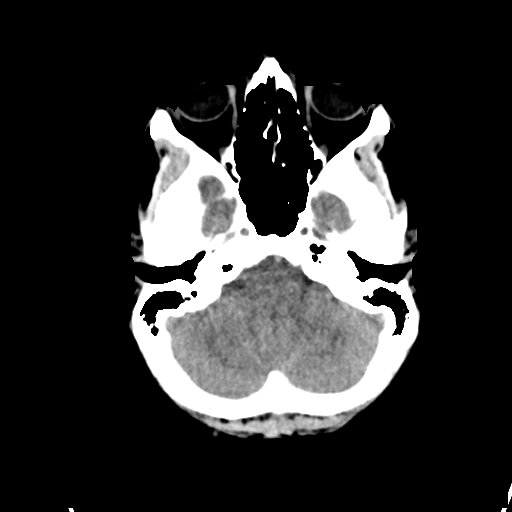
[im 13/35  brain]
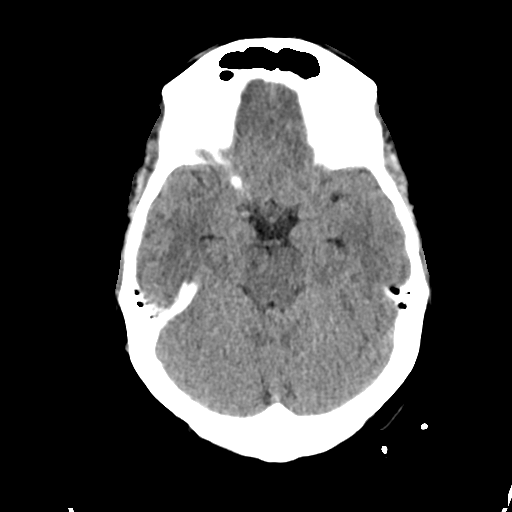
[im 18/35  brain]
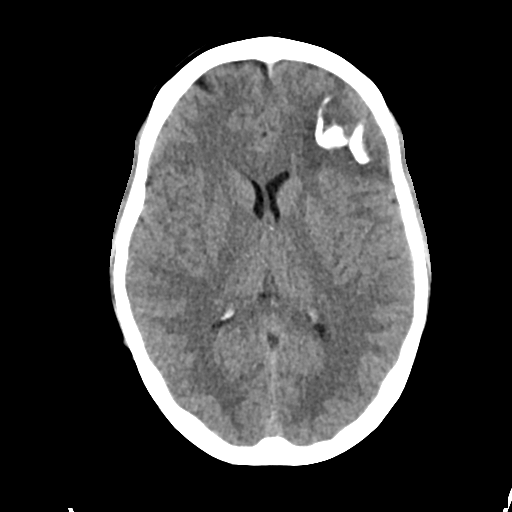
[im 22/35  brain]
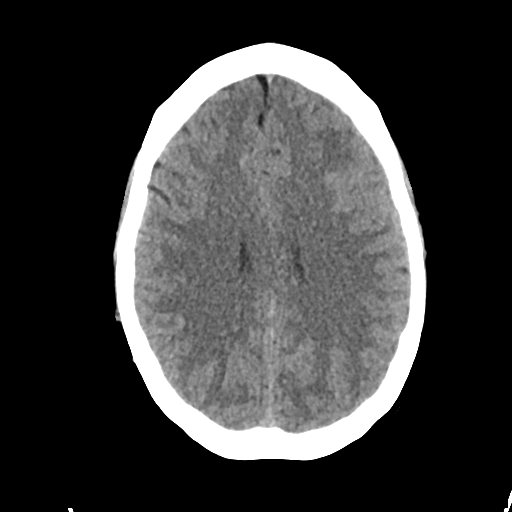
[im 22/35  bone]
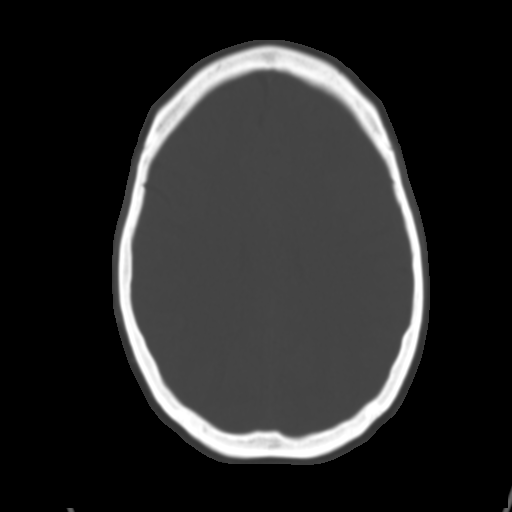
[im 26/35  brain]
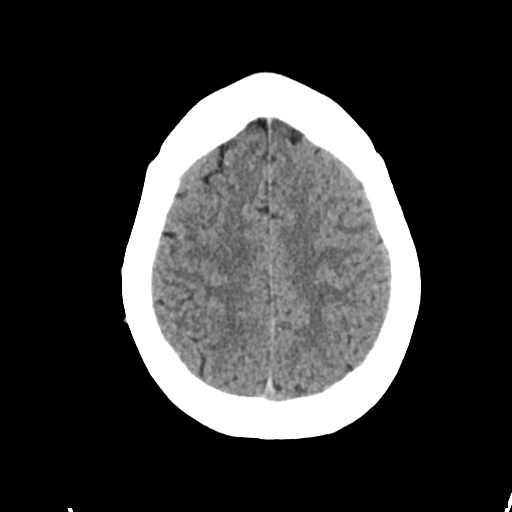
[im 30/35  brain]
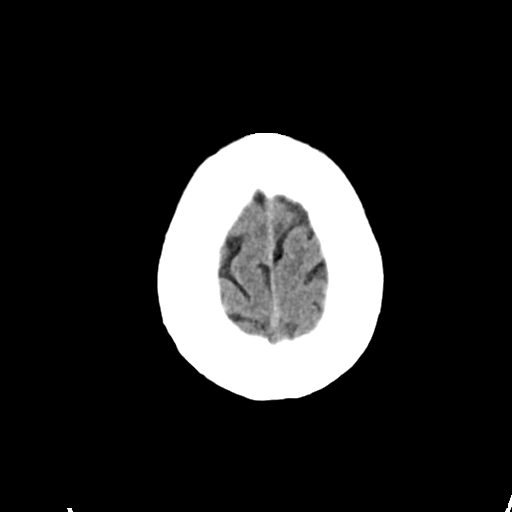

[Series 4: head bone · axial · 0.43mm/px · z∈[-84,-24]mm · 4 of 87 slices shown]
[im 9/87  bone]
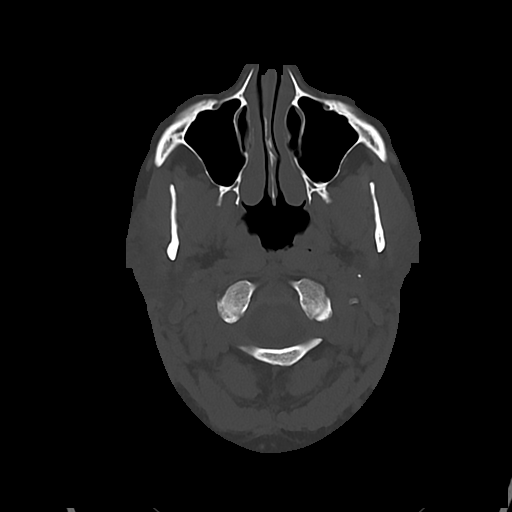
[im 18/87  bone]
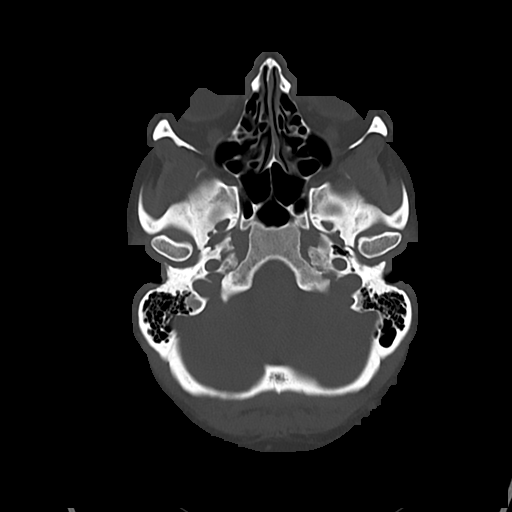
[im 26/87  bone]
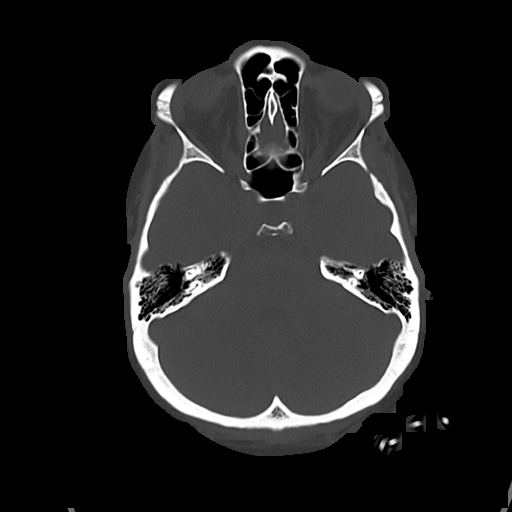
[im 39/87  bone]
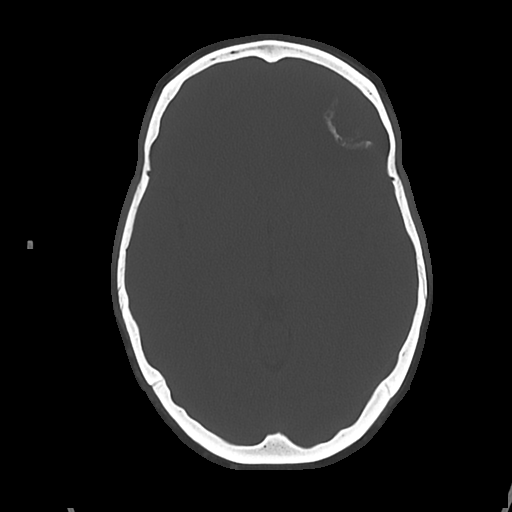

[Series 5: cor soft · coronal · 0.33mm/px · 3 of 69 slices shown]
[im 23/69  brain]
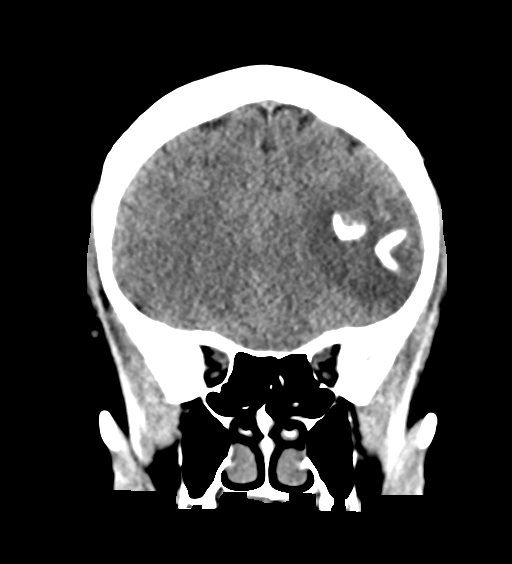
[im 31/69  brain]
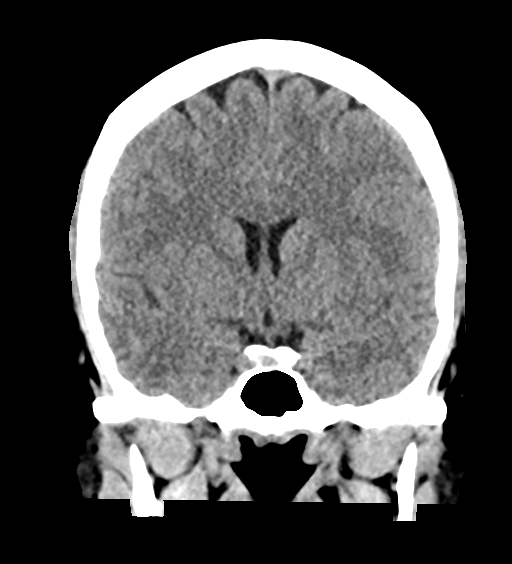
[im 38/69  brain]
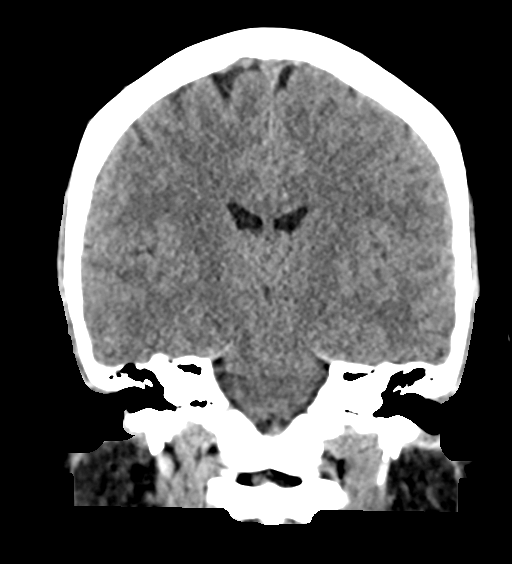

[Series 6: sag soft · sagittal · 0.36mm/px · 3 of 54 slices shown]
[im 18/54  brain]
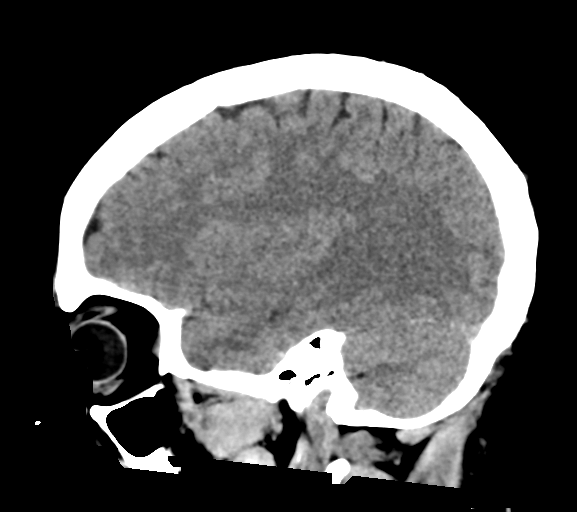
[im 27/54  brain]
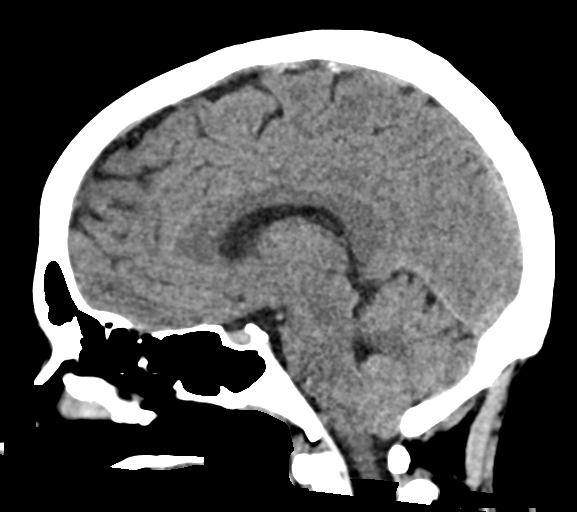
[im 36/54  brain]
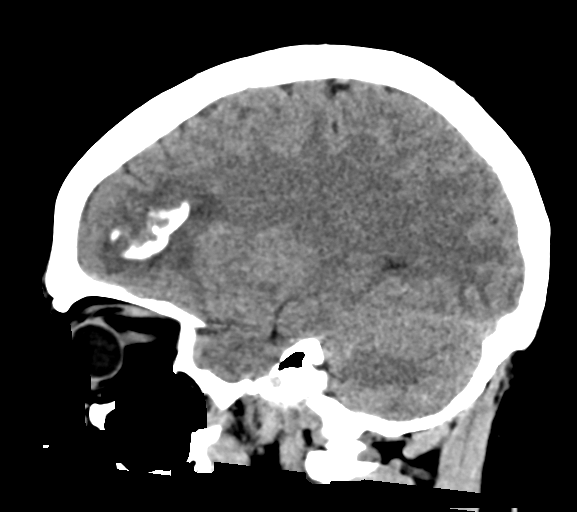

[17 of 47 positions shown; findings below may reference images not displayed]

FINDINGS: Brain: There is a large hypodense area seen within the left frontal
lobe with a rounded coarse calcifications seen within it. No midline
shift or extra-axial collections are seen. Normal gray-white
differentiation. Ventricles are normal in size and contour.

Vascular: No hyperdense vessel or unexpected calcification.

Skull: The skull is intact. No fracture or focal lesion identified.

Sinuses/Orbits: The visualized paranasal sinuses and mastoid air
cells are clear. The orbits and globes intact.

Other: None
IMPRESSION: Large left frontal lobe lesion with coarse internal calcifications
which could be due to prior infection, infectious process, and/or
neoplasm.

## 2021-01-13 DIAGNOSIS — C719 Malignant neoplasm of brain, unspecified: Secondary | ICD-10-CM | POA: Diagnosis not present

## 2021-01-13 DIAGNOSIS — R945 Abnormal results of liver function studies: Secondary | ICD-10-CM | POA: Diagnosis not present

## 2021-01-13 DIAGNOSIS — G939 Disorder of brain, unspecified: Secondary | ICD-10-CM | POA: Diagnosis not present

## 2021-01-13 DIAGNOSIS — R197 Diarrhea, unspecified: Secondary | ICD-10-CM | POA: Diagnosis not present

## 2021-01-13 DIAGNOSIS — R748 Abnormal levels of other serum enzymes: Secondary | ICD-10-CM | POA: Diagnosis not present

## 2021-01-13 DIAGNOSIS — C718 Malignant neoplasm of overlapping sites of brain: Secondary | ICD-10-CM | POA: Diagnosis not present

## 2021-01-20 DIAGNOSIS — R748 Abnormal levels of other serum enzymes: Secondary | ICD-10-CM | POA: Diagnosis not present

## 2021-01-22 DIAGNOSIS — C719 Malignant neoplasm of brain, unspecified: Secondary | ICD-10-CM | POA: Diagnosis not present

## 2021-01-22 DIAGNOSIS — Z9009 Acquired absence of other part of head and neck: Secondary | ICD-10-CM | POA: Diagnosis not present

## 2021-01-22 DIAGNOSIS — Z9221 Personal history of antineoplastic chemotherapy: Secondary | ICD-10-CM | POA: Diagnosis not present

## 2021-01-22 DIAGNOSIS — R748 Abnormal levels of other serum enzymes: Secondary | ICD-10-CM | POA: Diagnosis not present

## 2021-01-22 DIAGNOSIS — Z923 Personal history of irradiation: Secondary | ICD-10-CM | POA: Diagnosis not present

## 2021-01-22 DIAGNOSIS — R197 Diarrhea, unspecified: Secondary | ICD-10-CM | POA: Diagnosis not present

## 2021-01-22 LAB — HM HEPATITIS C SCREENING LAB: HM Hepatitis Screen: NEGATIVE

## 2021-01-28 DIAGNOSIS — R197 Diarrhea, unspecified: Secondary | ICD-10-CM | POA: Diagnosis not present

## 2021-03-02 DIAGNOSIS — R748 Abnormal levels of other serum enzymes: Secondary | ICD-10-CM | POA: Diagnosis not present

## 2021-03-05 DIAGNOSIS — R748 Abnormal levels of other serum enzymes: Secondary | ICD-10-CM | POA: Diagnosis not present

## 2021-03-05 DIAGNOSIS — R197 Diarrhea, unspecified: Secondary | ICD-10-CM | POA: Diagnosis not present

## 2021-03-09 DIAGNOSIS — F339 Major depressive disorder, recurrent, unspecified: Secondary | ICD-10-CM | POA: Diagnosis not present

## 2021-03-09 DIAGNOSIS — E2839 Other primary ovarian failure: Secondary | ICD-10-CM | POA: Diagnosis not present

## 2021-03-09 DIAGNOSIS — R197 Diarrhea, unspecified: Secondary | ICD-10-CM | POA: Diagnosis not present

## 2021-03-09 DIAGNOSIS — E559 Vitamin D deficiency, unspecified: Secondary | ICD-10-CM | POA: Diagnosis not present

## 2021-03-09 DIAGNOSIS — C719 Malignant neoplasm of brain, unspecified: Secondary | ICD-10-CM | POA: Diagnosis not present

## 2021-03-09 DIAGNOSIS — Z0001 Encounter for general adult medical examination with abnormal findings: Secondary | ICD-10-CM | POA: Diagnosis not present

## 2021-03-09 DIAGNOSIS — N912 Amenorrhea, unspecified: Secondary | ICD-10-CM | POA: Diagnosis not present

## 2021-03-23 DIAGNOSIS — R748 Abnormal levels of other serum enzymes: Secondary | ICD-10-CM | POA: Diagnosis not present

## 2021-03-26 DIAGNOSIS — K7689 Other specified diseases of liver: Secondary | ICD-10-CM | POA: Diagnosis not present

## 2021-03-26 DIAGNOSIS — R748 Abnormal levels of other serum enzymes: Secondary | ICD-10-CM | POA: Diagnosis not present

## 2021-04-01 DIAGNOSIS — R197 Diarrhea, unspecified: Secondary | ICD-10-CM | POA: Diagnosis not present

## 2021-04-01 DIAGNOSIS — E2839 Other primary ovarian failure: Secondary | ICD-10-CM | POA: Diagnosis not present

## 2021-04-01 DIAGNOSIS — F339 Major depressive disorder, recurrent, unspecified: Secondary | ICD-10-CM | POA: Diagnosis not present

## 2021-04-01 DIAGNOSIS — F4320 Adjustment disorder, unspecified: Secondary | ICD-10-CM | POA: Diagnosis not present

## 2021-04-14 DIAGNOSIS — Z452 Encounter for adjustment and management of vascular access device: Secondary | ICD-10-CM | POA: Diagnosis not present

## 2021-04-14 DIAGNOSIS — R32 Unspecified urinary incontinence: Secondary | ICD-10-CM | POA: Diagnosis not present

## 2021-04-14 DIAGNOSIS — Z79899 Other long term (current) drug therapy: Secondary | ICD-10-CM | POA: Diagnosis not present

## 2021-04-14 DIAGNOSIS — C719 Malignant neoplasm of brain, unspecified: Secondary | ICD-10-CM | POA: Diagnosis not present

## 2021-04-14 DIAGNOSIS — R197 Diarrhea, unspecified: Secondary | ICD-10-CM | POA: Diagnosis not present

## 2021-05-20 DIAGNOSIS — R748 Abnormal levels of other serum enzymes: Secondary | ICD-10-CM | POA: Diagnosis not present

## 2021-05-20 DIAGNOSIS — C719 Malignant neoplasm of brain, unspecified: Secondary | ICD-10-CM | POA: Diagnosis not present

## 2021-06-30 DIAGNOSIS — J101 Influenza due to other identified influenza virus with other respiratory manifestations: Secondary | ICD-10-CM | POA: Diagnosis not present

## 2021-06-30 DIAGNOSIS — Z20822 Contact with and (suspected) exposure to covid-19: Secondary | ICD-10-CM | POA: Diagnosis not present

## 2021-07-07 DIAGNOSIS — R197 Diarrhea, unspecified: Secondary | ICD-10-CM | POA: Diagnosis not present

## 2021-07-09 DIAGNOSIS — E8801 Alpha-1-antitrypsin deficiency: Secondary | ICD-10-CM | POA: Diagnosis not present

## 2021-07-09 DIAGNOSIS — R1011 Right upper quadrant pain: Secondary | ICD-10-CM | POA: Diagnosis not present

## 2021-07-09 DIAGNOSIS — R748 Abnormal levels of other serum enzymes: Secondary | ICD-10-CM | POA: Diagnosis not present

## 2021-07-09 DIAGNOSIS — R7989 Other specified abnormal findings of blood chemistry: Secondary | ICD-10-CM | POA: Diagnosis not present

## 2021-07-09 DIAGNOSIS — R197 Diarrhea, unspecified: Secondary | ICD-10-CM | POA: Diagnosis not present

## 2021-07-09 DIAGNOSIS — J00 Acute nasopharyngitis [common cold]: Secondary | ICD-10-CM | POA: Diagnosis not present

## 2021-07-21 DIAGNOSIS — Z79899 Other long term (current) drug therapy: Secondary | ICD-10-CM | POA: Diagnosis not present

## 2021-07-21 DIAGNOSIS — C711 Malignant neoplasm of frontal lobe: Secondary | ICD-10-CM | POA: Diagnosis not present

## 2021-07-21 DIAGNOSIS — C719 Malignant neoplasm of brain, unspecified: Secondary | ICD-10-CM | POA: Diagnosis not present

## 2021-10-15 DIAGNOSIS — R748 Abnormal levels of other serum enzymes: Secondary | ICD-10-CM | POA: Diagnosis not present

## 2021-10-20 DIAGNOSIS — Z79899 Other long term (current) drug therapy: Secondary | ICD-10-CM | POA: Diagnosis not present

## 2021-10-20 DIAGNOSIS — R519 Headache, unspecified: Secondary | ICD-10-CM | POA: Diagnosis not present

## 2021-10-20 DIAGNOSIS — C711 Malignant neoplasm of frontal lobe: Secondary | ICD-10-CM | POA: Diagnosis not present

## 2021-10-20 DIAGNOSIS — Z08 Encounter for follow-up examination after completed treatment for malignant neoplasm: Secondary | ICD-10-CM | POA: Diagnosis not present

## 2021-10-20 DIAGNOSIS — Z9889 Other specified postprocedural states: Secondary | ICD-10-CM | POA: Diagnosis not present

## 2021-10-20 DIAGNOSIS — Z9221 Personal history of antineoplastic chemotherapy: Secondary | ICD-10-CM | POA: Diagnosis not present

## 2021-10-20 DIAGNOSIS — Z85841 Personal history of malignant neoplasm of brain: Secondary | ICD-10-CM | POA: Diagnosis not present

## 2021-10-20 DIAGNOSIS — Z923 Personal history of irradiation: Secondary | ICD-10-CM | POA: Diagnosis not present

## 2021-11-10 DIAGNOSIS — C719 Malignant neoplasm of brain, unspecified: Secondary | ICD-10-CM | POA: Diagnosis not present

## 2021-11-17 DIAGNOSIS — C719 Malignant neoplasm of brain, unspecified: Secondary | ICD-10-CM | POA: Diagnosis not present

## 2021-11-23 DIAGNOSIS — R748 Abnormal levels of other serum enzymes: Secondary | ICD-10-CM | POA: Diagnosis not present

## 2021-11-23 DIAGNOSIS — R0683 Snoring: Secondary | ICD-10-CM | POA: Diagnosis not present

## 2021-11-23 DIAGNOSIS — R519 Headache, unspecified: Secondary | ICD-10-CM | POA: Diagnosis not present

## 2021-11-23 DIAGNOSIS — R5383 Other fatigue: Secondary | ICD-10-CM | POA: Diagnosis not present

## 2021-11-23 DIAGNOSIS — Z85841 Personal history of malignant neoplasm of brain: Secondary | ICD-10-CM | POA: Diagnosis not present

## 2021-11-30 DIAGNOSIS — G4733 Obstructive sleep apnea (adult) (pediatric): Secondary | ICD-10-CM | POA: Diagnosis not present

## 2022-01-03 ENCOUNTER — Encounter: Payer: Self-pay | Admitting: Family

## 2022-01-03 ENCOUNTER — Ambulatory Visit (INDEPENDENT_AMBULATORY_CARE_PROVIDER_SITE_OTHER): Payer: BC Managed Care – PPO | Admitting: Family

## 2022-01-03 VITALS — BP 90/58 | HR 74 | Temp 98.3°F | Ht 64.0 in | Wt 160.1 lb

## 2022-01-03 DIAGNOSIS — F32A Depression, unspecified: Secondary | ICD-10-CM | POA: Diagnosis not present

## 2022-01-03 DIAGNOSIS — F419 Anxiety disorder, unspecified: Secondary | ICD-10-CM | POA: Diagnosis not present

## 2022-01-03 DIAGNOSIS — R748 Abnormal levels of other serum enzymes: Secondary | ICD-10-CM | POA: Insufficient documentation

## 2022-01-03 MED ORDER — ESCITALOPRAM OXALATE 20 MG PO TABS: 20.0000 mg | ORAL_TABLET | Freq: Every day | ORAL | 1 refills | Status: DC

## 2022-01-03 MED ORDER — ESCITALOPRAM OXALATE 20 MG PO TABS: 20.0000 mg | ORAL_TABLET | Freq: Every day | ORAL | 2 refills | Status: DC

## 2022-01-03 NOTE — Progress Notes (Signed)
? ?New Patient Office Visit ? ?Subjective:  ?Patient ID: Alexandra Henderson, female    DOB: [birth year]  Age: 42 y.o. MRN: [id number] ? ?CC:  ?Chief Complaint  ?Patient presents with  ? Establish Care  ? ?HPI ?Alexandra Henderson presents for establishing care today. ?Anxiety/Depression: Patient complains of anxiety disorder and depression .   ?She has the following symptoms: feelings of losing control, irritable, racing thoughts.  ?Onset of symptoms was approximately  years ago, She denies current suicidal and homicidal ideation.  ?Possible organic causes contributing are: none.  ?Risk factors: previous episode of depression  ?Previous treatment includes Lexapro and Zoloft and individual therapy.  She complains of the following side effects from the treatment: none. ? ?  01/03/2022  ?  2:53 PM  ?Depression screen PHQ 2/9  ?Decreased Interest 1  ?Down, Depressed, Hopeless 1  ?PHQ - 2 Score 2  ?Altered sleeping 2  ?Tired, decreased energy 1  ?Change in appetite 0  ?Feeling bad or failure about yourself  3  ?Trouble concentrating 0  ?Moving slowly or fidgety/restless 0  ?Suicidal thoughts 1  ?PHQ-9 Score 9  ?Difficult doing work/chores Somewhat difficult  ? ? ?  01/03/2022  ?  2:54 PM  ?GAD 7 : Generalized Anxiety Score  ?Nervous, Anxious, on Edge 1  ?Control/stop worrying 2  ?Worry too much - different things 2  ?Trouble relaxing 2  ?Restless 2  ?Easily annoyed or irritable 1  ?Afraid - awful might happen 0  ?Total GAD 7 Score 10  ?Anxiety Difficulty Somewhat difficult  ? ? ?Assessment & Plan:  ? ?Problem List Items Addressed This Visit   ? ?  ? Other  ? Anxiety and depression - Primary  ?  Chronic - reports having more anxiety since off Lexapro for 2 weeks, not feeling depressed. 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 Discussed different medicine options, unsure if sx now are not just a result of being off the medication,  agreed to restart Lexapro and reassess in 1  month. ? ?  ?  ? Relevant Medications  ? escitalopram (LEXAPRO) 20 MG tablet  ? ? ?Past Medical History:  ?Diagnosis Date  ? Seizures ([place])   ? ? ?Past Surgical History:  ?Procedure Laterality Date  ? CESAREAN SECTION    ? 2  ? CRANIOTOMY    ? HIP ARTHROSCOPY Right   ? TUBAL LIGATION    ? WISDOM TOOTH EXTRACTION    ? ? ?Objective:  ? ?Today's Vitals: BP (!) 90/58 (BP Location: Left Arm, Patient Position: Sitting, Cuff Size: Large)   Pulse 74   Temp 98.3 ?F (36.8 ?C) (Temporal)   Ht '5\' 4"'$  (1.626 m)   Wt 160 lb 2 oz (72.6 kg)   LMP 12/28/2021 (Exact Date)   SpO2 100%   BMI 27.49 kg/m?  ? ?Physical Exam ?Vitals and nursing note reviewed.  ?Constitutional:   ?   Appearance: Normal appearance.  ?Cardiovascular:  ?   Rate and Rhythm: Normal rate and regular rhythm.  ?Pulmonary:  ?   Effort: Pulmonary effort is normal.  ?   Breath sounds: Normal breath sounds.  ?Musculoskeletal:     ?   General: Normal range of motion.  ?Skin: ?   General: Skin is warm and dry.  ?Neurological:  ?   Mental Status: She is alert.  ?Psychiatric:     ?   Mood and Affect: Mood normal.     ?  Behavior: Behavior normal.  ? ? ?Outpatient Encounter Medications as of 01/03/2022  ?Medication Sig  ? Multiple Vitamin (MULTIVITAMIN WITH MINERALS) TABS tablet Take 1 tablet by mouth daily.  ? topiramate (TOPAMAX) 50 MG tablet Take 50 mg by mouth 2 (two) times daily.  ? [DISCONTINUED] escitalopram (LEXAPRO) 20 MG tablet Take 20 mg by mouth daily.  ? [DISCONTINUED] lamoTRIgine (LAMICTAL) 150 MG tablet Take 1 tablet (150 mg total) by mouth daily.  ? escitalopram (LEXAPRO) 20 MG tablet Take 1 tablet (20 mg total) by mouth daily.  ? [DISCONTINUED] escitalopram (LEXAPRO) 20 MG tablet Take 1 tablet (20 mg total) by mouth daily.  ? ?No facility-administered encounter medications on file as of 01/03/2022.  ? ? ?Follow-up: Return in about 4 weeks (around 01/31/2022) for f/u anxiety, PAP smear.  ? ?Jeanie Sewer, NP ?

## 2022-01-03 NOTE — Assessment & Plan Note (Signed)
Patient followed by [facility] in [place], seen every 3 months for updated brain scans, no longer on chemo or radiation, but still has daily or at least 4 days/week headache that she takes Topamax for twice daily that decreases the intensity.  Denies trying any other medications, not told that seizures were a risk factor has not had 1 since first diagnosed with the tumor. ?

## 2022-01-03 NOTE — Assessment & Plan Note (Addendum)
Chronic - reports having more anxiety since off Lexapro for 2 weeks, not feeling depressed. 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 Discussed different medicine options, unsure if sx now are not just a result of being off the medication,  agreed to restart Lexapro and reassess in 1 month. ?

## 2022-01-03 NOTE — Patient Instructions (Addendum)
Welcome to [facility]! It was a pleasure meeting you today. ? ?I have sent your refill to your pharmacy. ?Please schedule a 1 month follow up visit today to check in on how you are doing back on the Lexapro and we can do a PAP smear at that time. ? ? ? ?PLEASE NOTE: ? ?If you had any LAB tests please let us know if you have not heard back within a few days. You may see your results on MyChart before we have a chance to review them but we will give you a call once they are reviewed by [place]. If we ordered any REFERRALS today, please let us know if you have not heard from their office within the next week.  ?Let us know through MyChart if you are needing REFILLS, or have your pharmacy send [place] the request. You can also use MyChart to communicate with me or any office staff. ? ?Please try these tips to maintain a healthy lifestyle: ? ?Eat most of your calories during the day when you are active. Eliminate processed foods including packaged sweets (pies, cakes, cookies), reduce intake of potatoes, white bread, white pasta, and white rice. Look for whole grain options, oat flour or almond flour. ? ?Each meal should contain half fruits/vegetables, one quarter protein, and one quarter carbs (no bigger than a computer mouse). ? ?Cut down on sweet beverages. This includes juice, soda, and sweet tea. Also watch fruit intake, though this is a healthier sweet option, it still contains natural sugar! Limit to 3 servings daily. ? ?Drink at least 1 glass of water with each meal and aim for at least 8 glasses per day ? ?Exercise at least 150 minutes every week.  ? ?

## 2022-01-31 ENCOUNTER — Other Ambulatory Visit (HOSPITAL_COMMUNITY)
Admission: RE | Admit: 2022-01-31 | Discharge: 2022-01-31 | Disposition: A | Discharge status: Home / Self Care | Payer: BC Managed Care – PPO | Source: Ambulatory Visit | Attending: Family | Admitting: Family

## 2022-01-31 ENCOUNTER — Encounter: Payer: Self-pay | Admitting: Family

## 2022-01-31 ENCOUNTER — Ambulatory Visit (INDEPENDENT_AMBULATORY_CARE_PROVIDER_SITE_OTHER): Payer: BC Managed Care – PPO | Admitting: Family

## 2022-01-31 VITALS — BP 84/58 | HR 73 | Temp 98.2°F | Ht 64.0 in | Wt 161.4 lb

## 2022-01-31 DIAGNOSIS — Z124 Encounter for screening for malignant neoplasm of cervix: Secondary | ICD-10-CM

## 2022-01-31 DIAGNOSIS — F32A Depression, unspecified: Secondary | ICD-10-CM

## 2022-01-31 DIAGNOSIS — F419 Anxiety disorder, unspecified: Secondary | ICD-10-CM

## 2022-01-31 MED ORDER — VENLAFAXINE HCL ER 37.5 MG PO CP24: 37.5000 mg | ORAL_CAPSULE | Freq: Every day | ORAL | 2 refills | Status: DC

## 2022-01-31 NOTE — Assessment & Plan Note (Signed)
Chronic - restarted Lexapro, but she doesn't think it's working as well as before. Discussed augmenting with Bupropion vs. starting Effexor, pt would like to start Effexor, advised ok to start tomorrow, no weaning off Lexapro needed. f/u in 3 mos or sooner prn.

## 2022-01-31 NOTE — Progress Notes (Signed)
Subjective:     Patient ID: Alexandra Henderson, female    DOB: 13-Jul-1981, 41 y.o.   MRN: [id number]  Chief Complaint  Patient presents with   Anxiety    Pt states it has been ok.    Gynecologic Exam    Pap Smear   HPI: Anxiety/Depression: Patient complains of anxiety disorder and depression.   She has the following symptoms: feelings of losing control, irritable, racing thoughts.  Onset of symptoms was approximately  years ago, She denies current suicidal and homicidal ideation.  Possible organic causes contributing are: none.  Risk factors: previous episode of depression  Previous treatment includes Lexapro and Zoloft and individual therapy.  Reports restarting the Lexapro, but not feeling it's working up to full potential.  Assessment & Plan:   Problem List Items Addressed This Visit       Other   Anxiety and depression - Primary    Chronic - restarted Lexapro, but she doesn't think it's working as well as before. Discussed augmenting with Bupropion vs. starting Effexor, pt would like to start Effexor, advised ok to start tomorrow, no weaning off Lexapro needed. f/u in 3 mos or sooner prn.       Relevant Medications   venlafaxine XR (EFFEXOR XR) 37.5 MG 24 hr capsule   Other Visit Diagnoses     Encounter for Pap smear of cervix with HPV DNA cotesting       Relevant Orders   Cytology - PAP       Outpatient Medications Prior to Visit  Medication Sig Dispense Refill   Multiple Vitamin (MULTIVITAMIN WITH MINERALS) TABS tablet Take 1 tablet by mouth daily.     topiramate (TOPAMAX) 50 MG tablet Take 50 mg by mouth 2 (two) times daily.     escitalopram (LEXAPRO) 20 MG tablet Take 1 tablet (20 mg total) by mouth daily. 30 tablet 2   No facility-administered medications prior to visit.    Past Medical History:  Diagnosis Date   Seizures ([place])     Past Surgical History:  Procedure Laterality Date   CESAREAN SECTION     2   CRANIOTOMY     HIP ARTHROSCOPY Right     TUBAL LIGATION     WISDOM TOOTH EXTRACTION     Allergies  Allergen Reactions   Penicillins Hives       Objective:    Physical Exam Vitals and nursing note reviewed.  Constitutional:      Appearance: Normal appearance.  Cardiovascular:     Rate and Rhythm: Normal rate and regular rhythm.  Pulmonary:     Effort: Pulmonary effort is normal.     Breath sounds: Normal breath sounds.  Genitourinary:    Exam position: Lithotomy position.     Pubic Area: No rash or pubic lice.      Labia:        Right: No rash.        Left: No rash.      Vagina: Normal.     Cervix: Normal.     Comments: PAP smear specimen obtained Musculoskeletal:        General: Normal range of motion.  Skin:    General: Skin is warm and dry.  Neurological:     Mental Status: She is alert.  Psychiatric:        Mood and Affect: Mood normal.        Behavior: Behavior normal.    BP (!) 84/58 (BP Location: Left Arm, Patient Position:  Sitting, Cuff Size: Large)   Pulse 73   Temp 98.2 F (36.8 C) (Temporal)   Ht '5\' 4"'$  (1.626 m)   Wt 161 lb 6 oz (73.2 kg)   LMP 01/19/2022 (Exact Date)   SpO2 100%   BMI 27.70 kg/m  Wt Readings from Last 3 Encounters:  01/31/22 161 lb 6 oz (73.2 kg)  01/03/22 160 lb 2 oz (72.6 kg)        Meds ordered this encounter  Medications   venlafaxine XR (EFFEXOR XR) 37.5 MG 24 hr capsule    Sig: Take 1 capsule (37.5 mg total) by mouth daily with breakfast.    Dispense:  30 capsule    Refill:  2    D/C Lexapro    Order Specific Question:   Supervising Provider    Answer:   ANDY, CAMILLE L [[id number]]    Jeanie Sewer, NP

## 2022-02-02 LAB — CYTOLOGY - PAP
Comment: NEGATIVE
Diagnosis: NEGATIVE
High risk HPV: NEGATIVE

## 2022-02-02 NOTE — Progress Notes (Signed)
Hi Bryanda,  Your pap smear and HPV testing are negative. Recommend rechecking in 3-5 years.  Take care!

## 2022-02-16 DIAGNOSIS — Z08 Encounter for follow-up examination after completed treatment for malignant neoplasm: Secondary | ICD-10-CM | POA: Diagnosis not present

## 2022-02-16 DIAGNOSIS — C718 Malignant neoplasm of overlapping sites of brain: Secondary | ICD-10-CM | POA: Diagnosis not present

## 2022-02-16 DIAGNOSIS — C711 Malignant neoplasm of frontal lobe: Secondary | ICD-10-CM | POA: Diagnosis not present

## 2022-02-16 DIAGNOSIS — Z85841 Personal history of malignant neoplasm of brain: Secondary | ICD-10-CM | POA: Diagnosis not present

## 2022-02-16 DIAGNOSIS — R519 Headache, unspecified: Secondary | ICD-10-CM | POA: Diagnosis not present

## 2022-04-15 DIAGNOSIS — R197 Diarrhea, unspecified: Secondary | ICD-10-CM | POA: Diagnosis not present

## 2022-04-15 DIAGNOSIS — R748 Abnormal levels of other serum enzymes: Secondary | ICD-10-CM | POA: Diagnosis not present

## 2022-04-15 DIAGNOSIS — Z79899 Other long term (current) drug therapy: Secondary | ICD-10-CM | POA: Diagnosis not present

## 2022-04-15 DIAGNOSIS — R152 Fecal urgency: Secondary | ICD-10-CM | POA: Diagnosis not present

## 2022-04-27 ENCOUNTER — Other Ambulatory Visit: Payer: Self-pay | Admitting: Family

## 2022-04-27 DIAGNOSIS — F419 Anxiety disorder, unspecified: Secondary | ICD-10-CM

## 2022-05-08 NOTE — Progress Notes (Unsigned)
   Patient ID: Alexandra Henderson, female    DOB: September 10, 1980, 41 y.o.   MRN: [id number]  No chief complaint on file.   HPI: Anxiety/Depression: Patient complains of anxiety disorder and depression.   She has the following symptoms: feelings of losing control, irritable, racing thoughts.  Onset of symptoms was approximately  years ago, She denies current suicidal and homicidal ideation.  Possible organic causes contributing are: none.  Risk factors: previous episode of depression  Previous treatment includes Lexapro and Zoloft and individual therapy.  Reports restarting the Lexapro, but not feeling it's working up to full potential.   Assessment & Plan:   Problem List Items Addressed This Visit   None   Subjective:    Outpatient Medications Prior to Visit  Medication Sig Dispense Refill  . Multiple Vitamin (MULTIVITAMIN WITH MINERALS) TABS tablet Take 1 tablet by mouth daily.    Marland Kitchen topiramate (TOPAMAX) 50 MG tablet Take 50 mg by mouth 2 (two) times daily.    Marland Kitchen venlafaxine XR (EFFEXOR-XR) 37.5 MG 24 hr capsule TAKE 1 CAPSULE(37.5 MG) BY MOUTH DAILY WITH BREAKFAST 30 capsule 2   No facility-administered medications prior to visit.   Past Medical History:  Diagnosis Date  . Seizures ([place])    Past Surgical History:  Procedure Laterality Date  . CESAREAN SECTION     2  . CRANIOTOMY    . HIP ARTHROSCOPY Right   . TUBAL LIGATION    . WISDOM TOOTH EXTRACTION     Allergies  Allergen Reactions  . Penicillins Hives      Objective:    Physical Exam Vitals and nursing note reviewed.  Constitutional:      Appearance: Normal appearance.  Cardiovascular:     Rate and Rhythm: Normal rate and regular rhythm.  Pulmonary:     Effort: Pulmonary effort is normal.     Breath sounds: Normal breath sounds.  Musculoskeletal:        General: Normal range of motion.  Skin:    General: Skin is warm and dry.  Neurological:     Mental Status: She is alert.  Psychiatric:        Mood and  Affect: Mood normal.        Behavior: Behavior normal.  There were no vitals taken for this visit. Wt Readings from Last 3 Encounters:  01/31/22 161 lb 6 oz (73.2 kg)  01/03/22 160 lb 2 oz (72.6 kg)       Jeanie Sewer, NP

## 2022-05-09 ENCOUNTER — Ambulatory Visit (INDEPENDENT_AMBULATORY_CARE_PROVIDER_SITE_OTHER): Payer: BC Managed Care – PPO | Admitting: Family

## 2022-05-09 ENCOUNTER — Encounter: Payer: Self-pay | Admitting: Family

## 2022-05-09 VITALS — BP 99/69 | HR 68 | Temp 98.2°F | Ht 64.0 in | Wt 148.4 lb

## 2022-05-09 DIAGNOSIS — F32A Depression, unspecified: Secondary | ICD-10-CM | POA: Diagnosis not present

## 2022-05-09 DIAGNOSIS — F419 Anxiety disorder, unspecified: Secondary | ICD-10-CM | POA: Diagnosis not present

## 2022-05-09 MED ORDER — VORTIOXETINE HBR 5 MG PO TABS: 5.0000 mg | ORAL_TABLET | Freq: Every day | ORAL | 2 refills | Status: DC

## 2022-05-09 NOTE — Assessment & Plan Note (Addendum)
   chronic  has failed Effexor and Lexapro, reports Effexor causing a lot of irritability and anger  depression is better, mainly dealing with anxiety now  switching to Trintellix '5mg'$  qd, advised on use & SE, will start PA  f/u 1 month in office or virtual

## 2022-05-09 NOTE — Patient Instructions (Signed)
It was very nice to see you today!    Stop Effexor. Start the Trintellix samples, 1 pill every morning AFTER eating. Follow up in 1 month - in office or virtual via [place].       PLEASE NOTE:  If you had any lab tests please let us know if you have not heard back within a few days. You may see your results on MyChart before we have a chance to review them but we will give you a call once they are reviewed by [place]. If we ordered any referrals today, please let us know if you have not heard from their office within the next week.

## 2022-06-05 NOTE — Progress Notes (Unsigned)
   Patient ID: Alexandra Henderson, female    DOB: 12/05/1980, 41 y.o.   MRN: [id number]  No chief complaint on file.   HPI: Anxiety/Depression: Patient complains of anxiety disorder and depression.   She has the following symptoms: feelings of losing control, irritable, racing thoughts.  Onset of symptoms was approximately  years ago, She denies current suicidal and homicidal ideation. Possible organic causes contributing are: none.  Risk factors: previous episode of depression  Previous treatment includes Lexapro and Zoloft and individual therapy.  Reports restarting the Lexapro, but not feeling it's working up to full potential. Started Effexor 3 mos ago and reports it causing a lot of irritability and anger.    Assessment & Plan:   Problem List Items Addressed This Visit   None   Subjective:    Outpatient Medications Prior to Visit  Medication Sig Dispense Refill  . Multiple Vitamin (MULTIVITAMIN WITH MINERALS) TABS tablet Take 1 tablet by mouth daily.    Marland Kitchen topiramate (TOPAMAX) 50 MG tablet Take 50 mg by mouth 2 (two) times daily.    Marland Kitchen vortioxetine HBr (TRINTELLIX) 5 MG TABS tablet Take 1 tablet (5 mg total) by mouth daily after breakfast. 30 tablet 2   No facility-administered medications prior to visit.   Past Medical History:  Diagnosis Date  . Seizures ([place])    Past Surgical History:  Procedure Laterality Date  . CESAREAN SECTION     2  . CRANIOTOMY    . HIP ARTHROSCOPY Right   . TUBAL LIGATION    . WISDOM TOOTH EXTRACTION     Allergies  Allergen Reactions  . Penicillins Hives      Objective:    Physical Exam Vitals and nursing note reviewed.  Constitutional:      Appearance: Normal appearance.  Cardiovascular:     Rate and Rhythm: Normal rate and regular rhythm.  Pulmonary:     Effort: Pulmonary effort is normal.     Breath sounds: Normal breath sounds.  Musculoskeletal:        General: Normal range of motion.  Skin:    General: Skin is warm and dry.   Neurological:     Mental Status: She is alert.  Psychiatric:        Mood and Affect: Mood normal.        Behavior: Behavior normal.  LMP 05/06/2022 (Exact Date)  Wt Readings from Last 3 Encounters:  05/09/22 148 lb 6.4 oz (67.3 kg)  01/31/22 161 lb 6 oz (73.2 kg)  01/03/22 160 lb 2 oz (72.6 kg)       Jeanie Sewer, NP

## 2022-06-06 ENCOUNTER — Ambulatory Visit (INDEPENDENT_AMBULATORY_CARE_PROVIDER_SITE_OTHER): Payer: BC Managed Care – PPO | Admitting: Family

## 2022-06-06 ENCOUNTER — Encounter: Payer: Self-pay | Admitting: Family

## 2022-06-06 VITALS — BP 99/69 | HR 69 | Temp 98.6°F | Ht 64.0 in | Wt 148.4 lb

## 2022-06-06 DIAGNOSIS — F32A Depression, unspecified: Secondary | ICD-10-CM

## 2022-06-06 DIAGNOSIS — F419 Anxiety disorder, unspecified: Secondary | ICD-10-CM

## 2022-06-06 MED ORDER — BUPROPION HCL ER (SR) 100 MG PO TB12: 100.0000 mg | ORAL_TABLET | Freq: Two times a day (BID) | ORAL | 0 refills | Status: DC

## 2022-06-06 NOTE — Assessment & Plan Note (Addendum)
   chronic  failed Lexapro & Effexor, given samples of Trintellix, did not let us know she ran out & didn't get from pharmacy, d/t causing nausea every day.  will send Wellbutrin 12hr '100mg'$ , start qam, increase to bid if tolerated. Also reports never trying Prozac, may add this on.  pt not in therapy, states hard to find time,  advised to start this & can do online, will be glad to refer to our therapy also as believe they do virtual also  f/u in 1 month or prn

## 2022-06-06 NOTE — Patient Instructions (Addendum)
It was very nice to see you today -     I have sent over generic Wellbutrin to your pharmacy. Start this today.  Please check online therapy resources if in-person is too hard to do right now. [facility] is one group I would try. I can also refer you to our therapy office as virtual visits are also offered I believe after an initial consult.  If you feel you need to talk to someone today and/or feel suicidal, please call the local [facility] urgent [facility] clinic located at [place], or just walk in and someone will meet with you.  Schedule a 1 month follow up visit, but let me know if you are having concerns with the medicine prior to then.       PLEASE NOTE:  If you had any lab tests please let us know if you have not heard back within a few days. You may see your results on MyChart before we have a chance to review them but we will give you a call once they are reviewed by [place]. If we ordered any referrals today, please let us know if you have not heard from their office within the next week.

## 2022-06-15 DIAGNOSIS — C719 Malignant neoplasm of brain, unspecified: Secondary | ICD-10-CM | POA: Diagnosis not present

## 2022-06-15 DIAGNOSIS — Z923 Personal history of irradiation: Secondary | ICD-10-CM | POA: Diagnosis not present

## 2022-06-15 DIAGNOSIS — Z79899 Other long term (current) drug therapy: Secondary | ICD-10-CM | POA: Diagnosis not present

## 2022-06-15 DIAGNOSIS — Z08 Encounter for follow-up examination after completed treatment for malignant neoplasm: Secondary | ICD-10-CM | POA: Diagnosis not present

## 2022-06-15 DIAGNOSIS — Z85841 Personal history of malignant neoplasm of brain: Secondary | ICD-10-CM | POA: Diagnosis not present

## 2022-06-15 DIAGNOSIS — R519 Headache, unspecified: Secondary | ICD-10-CM | POA: Diagnosis not present

## 2022-07-03 ENCOUNTER — Other Ambulatory Visit: Payer: Self-pay | Admitting: Family

## 2022-07-03 DIAGNOSIS — F32A Depression, unspecified: Secondary | ICD-10-CM

## 2022-07-07 ENCOUNTER — Encounter: Payer: Self-pay | Admitting: Family

## 2022-07-07 ENCOUNTER — Telehealth (INDEPENDENT_AMBULATORY_CARE_PROVIDER_SITE_OTHER): Payer: BC Managed Care – PPO | Admitting: Family

## 2022-07-07 VITALS — Ht 64.0 in | Wt 146.0 lb

## 2022-07-07 DIAGNOSIS — F32A Depression, unspecified: Secondary | ICD-10-CM

## 2022-07-07 DIAGNOSIS — F419 Anxiety disorder, unspecified: Secondary | ICD-10-CM

## 2022-07-07 MED ORDER — BUPROPION HCL ER (XL) 150 MG PO TB24: 150.0000 mg | ORAL_TABLET | Freq: Every day | ORAL | 1 refills | Status: DC

## 2022-07-07 NOTE — Assessment & Plan Note (Signed)
chronic started Wellbutrin '100mg'$  SR bid, helped sx, 2nd dose caused insomnia, stopped taking and states she doesn't want to get out of bed in the mornings, so needs 24h coverage sending '150mg'$  ER  advised to let me know if working, no f/u needed for 3 mos, if not, then we need a visit to discuss

## 2022-07-07 NOTE — Progress Notes (Signed)
MyChart Video Visit    Virtual Visit via Video Note   This format is felt to be most appropriate for this patient at this time. Physical exam was limited by quality of the video and audio technology used for the visit. CMA was able to get the patient set up on a video visit.  Patient location: Home. Patient and provider in visit Provider location: Office  I discussed the limitations of evaluation and management by telemedicine and the availability of in person appointments. The patient expressed understanding and agreed to proceed.  Visit Date: 07/07/2022  Today's healthcare provider: Jeanie Sewer, NP     Subjective:   Patient ID: Alexandra Henderson, female    DOB: 18-Jan-1981, 41 y.o.   MRN: [id number]  Chief Complaint  Patient presents with   Mood     Anxiety and depression   HPI Anxiety/Depression: Patient complains of anxiety disorder and depression.   She has the following symptoms: feelings of losing control, irritable, racing thoughts.  Onset of symptoms was approximately  years ago, She denies current suicidal and homicidal ideation. Possible organic causes contributing are: none.  Risk factors: previous episode of depression  Previous treatment includes Lexapro and Zoloft, Effexor and individual therapy. Trintellix not covered, caused nausea. Started Bupropion '100mg'$  SR bid, reports helping, but 2nd dose causes insomnia.  Assessment & Plan:   Problem List Items Addressed This Visit       Other   Anxiety and depression - Primary    chronic started Wellbutrin '100mg'$  SR bid, helped sx, 2nd dose caused insomnia, stopped taking and states she doesn't want to get out of bed in the mornings, so needs 24h coverage sending '150mg'$  ER  advised to let me know if working, no f/u needed for 3 mos, if not, then we need a visit to discuss      Relevant Medications   buPROPion (WELLBUTRIN XL) 150 MG 24 hr tablet    Past Medical History:  Diagnosis Date   Seizures ([place])      Past Surgical History:  Procedure Laterality Date   CESAREAN SECTION     2   CRANIOTOMY     HIP ARTHROSCOPY Right    TUBAL LIGATION     WISDOM TOOTH EXTRACTION      Outpatient Medications Prior to Visit  Medication Sig Dispense Refill   Multiple Vitamin (MULTIVITAMIN WITH MINERALS) TABS tablet Take 1 tablet by mouth daily.     topiramate (TOPAMAX) 50 MG tablet Take 50 mg by mouth 2 (two) times daily.     buPROPion ER (WELLBUTRIN SR) 100 MG 12 hr tablet Take 1 tablet (100 mg total) by mouth 2 (two) times daily. Start with one pill in the morning for 3-4 days, then increase to twice a day if tolerated. 60 tablet 0   vortioxetine HBr (TRINTELLIX) 5 MG TABS tablet Take 1 tablet (5 mg total) by mouth daily after breakfast. (Patient not taking: Reported on 06/06/2022) 30 tablet 2   No facility-administered medications prior to visit.    Allergies  Allergen Reactions   Penicillins Hives      Objective:   Physical Exam Vitals and nursing note reviewed.  Constitutional:      General: Pt is not in acute distress.    Appearance: Normal appearance.  HENT:     Head: Normocephalic.  Pulmonary:     Effort: No respiratory distress.  Musculoskeletal:     Cervical back: Normal range of motion.  Skin:  General: Skin is dry.     Coloration: Skin is not pale.  Neurological:     Mental Status: Pt is alert and oriented to person, place, and time.  Psychiatric:        Mood and Affect: Mood normal.   Ht '5\' 4"'$  (1.626 m)   Wt 146 lb (66.2 kg)   LMP 07/04/2022 (Exact Date)   BMI 25.06 kg/m   Wt Readings from Last 3 Encounters:  07/07/22 146 lb (66.2 kg)  06/06/22 148 lb 6 oz (67.3 kg)  05/09/22 148 lb 6.4 oz (67.3 kg)       I discussed the assessment and treatment plan with the patient. The patient was provided an opportunity to ask questions and all were answered. The patient agreed with the plan and demonstrated an understanding of the instructions.   The patient was  advised to call back or seek an in-person evaluation if the symptoms worsen or if the condition fails to improve as anticipated.  Jeanie Sewer, NP [place] [phone] (phone) [phone] (fax)  [place]

## 2022-07-08 DIAGNOSIS — C719 Malignant neoplasm of brain, unspecified: Secondary | ICD-10-CM | POA: Diagnosis not present

## 2022-07-08 DIAGNOSIS — R2990 Unspecified symptoms and signs involving the nervous system: Secondary | ICD-10-CM | POA: Diagnosis not present

## 2022-08-07 ENCOUNTER — Other Ambulatory Visit: Payer: Self-pay | Admitting: Family

## 2022-08-07 DIAGNOSIS — F32A Depression, unspecified: Secondary | ICD-10-CM

## 2022-08-11 NOTE — Telephone Encounter (Signed)
ok to refill for 90d no refill, thx

## 2022-09-18 DIAGNOSIS — G4753 Recurrent isolated sleep paralysis: Secondary | ICD-10-CM | POA: Diagnosis not present

## 2022-09-18 DIAGNOSIS — G479 Sleep disorder, unspecified: Secondary | ICD-10-CM | POA: Diagnosis not present

## 2022-10-01 DIAGNOSIS — C719 Malignant neoplasm of brain, unspecified: Secondary | ICD-10-CM | POA: Diagnosis not present

## 2022-10-01 DIAGNOSIS — G4720 Circadian rhythm sleep disorder, unspecified type: Secondary | ICD-10-CM | POA: Diagnosis not present

## 2022-10-01 DIAGNOSIS — F518 Other sleep disorders not due to a substance or known physiological condition: Secondary | ICD-10-CM | POA: Diagnosis not present

## 2022-10-17 DIAGNOSIS — G478 Other sleep disorders: Secondary | ICD-10-CM | POA: Diagnosis not present

## 2022-10-19 DIAGNOSIS — C719 Malignant neoplasm of brain, unspecified: Secondary | ICD-10-CM | POA: Diagnosis not present

## 2022-11-12 ENCOUNTER — Other Ambulatory Visit: Payer: Self-pay | Admitting: Family

## 2022-11-12 DIAGNOSIS — F32A Depression, unspecified: Secondary | ICD-10-CM

## 2022-12-06 ENCOUNTER — Encounter: Payer: Self-pay | Admitting: Family

## 2022-12-12 ENCOUNTER — Telehealth (INDEPENDENT_AMBULATORY_CARE_PROVIDER_SITE_OTHER): Payer: BC Managed Care – PPO | Admitting: Family

## 2022-12-12 ENCOUNTER — Encounter: Payer: Self-pay | Admitting: Family

## 2022-12-12 VITALS — Ht 64.0 in | Wt 150.0 lb

## 2022-12-12 DIAGNOSIS — F32A Depression, unspecified: Secondary | ICD-10-CM

## 2022-12-12 DIAGNOSIS — F419 Anxiety disorder, unspecified: Secondary | ICD-10-CM | POA: Diagnosis not present

## 2022-12-12 MED ORDER — BUPROPION HCL ER (XL) 150 MG PO TB24: 150.0000 mg | ORAL_TABLET | Freq: Every day | ORAL | 1 refills | Status: DC

## 2022-12-12 NOTE — Progress Notes (Signed)
MyChart Video Visit    Virtual Visit via Video Note   This format is felt to be most appropriate for this patient at this time. Physical exam was limited by quality of the video and audio technology used for the visit. CMA was able to get the patient set up on a video visit.  Patient location: Home. Patient and provider in visit Provider location: Office  I discussed the limitations of evaluation and management by telemedicine and the availability of in person appointments. The patient expressed understanding and agreed to proceed.  Visit Date: 12/12/2022  Today's healthcare provider: Dulce Sellar, NP     Subjective:   Patient ID: Alexandra Henderson, female    DOB: 1980-09-30, 42 y.o.   MRN: [id number]  Chief Complaint  Patient presents with   Anxiety   Depression   HPI Anxiety/Depression: Patient complains of anxiety disorder and depression.   She has the following symptoms: feelings of losing control, irritable, racing thoughts.  Onset of symptoms was approximately  years ago, She denies current suicidal and homicidal ideation. Possible organic causes contributing are: none.  Risk factors: previous episode of depression  Previous treatment includes Lexapro and Zoloft, Effexor and individual therapy. Trintellix not covered, caused nausea. Started Bupropion 100mg  SR bid, 2nd dose caused insomnia, switched to 150mg  ER qd, states she is doing well on this.  Assessment & Plan:  Anxiety and depression Assessment & Plan: chronic, stable taking Wellbutrin 150mg  ER qd states she is doing well with this dose sending refill f/u 6 mos   Orders: -     buPROPion HCl ER (XL); Take 1 tablet (150 mg total) by mouth daily.  Dispense: 90 tablet; Refill: 1   Past Medical History:  Diagnosis Date   Seizures     Past Surgical History:  Procedure Laterality Date   CESAREAN SECTION     2   CRANIOTOMY     HIP ARTHROSCOPY Right    TUBAL LIGATION     WISDOM TOOTH EXTRACTION       Outpatient Medications Prior to Visit  Medication Sig Dispense Refill   Multiple Vitamin (MULTIVITAMIN WITH MINERALS) TABS tablet Take 1 tablet by mouth daily.     topiramate (TOPAMAX) 50 MG tablet Take 50 mg by mouth 2 (two) times daily.     buPROPion (WELLBUTRIN XL) 150 MG 24 hr tablet TAKE 1 TABLET(150 MG) BY MOUTH EVERY MORNING FOR 7 DAYS. INCREASE TO 2 TABLETS BY MOUTH EVERY MORNING (Patient taking differently: Take 150 mg by mouth daily.) 90 tablet 0   No facility-administered medications prior to visit.    Allergies  Allergen Reactions   Penicillins Hives      Objective:   Physical Exam Vitals and nursing note reviewed.  Constitutional:      General: Pt is not in acute distress.    Appearance: Normal appearance.  HENT:     Head: Normocephalic.  Pulmonary:     Effort: No respiratory distress.  Musculoskeletal:     Cervical back: Normal range of motion.  Skin:    General: Skin is dry.     Coloration: Skin is not pale.  Neurological:     Mental Status: Pt is alert and oriented to person, place, and time.  Psychiatric:        Mood and Affect: Mood normal.   Ht 5\' 4"  (1.626 m)   Wt 150 lb (68 kg)   LMP 12/04/2022 (Approximate)   BMI 25.75 kg/m   Wt Readings from  Last 3 Encounters:  12/12/22 150 lb (68 kg)  07/07/22 146 lb (66.2 kg)  06/06/22 148 lb 6 oz (67.3 kg)     I discussed the assessment and treatment plan with the patient. The patient was provided an opportunity to ask questions and all were answered. The patient agreed with the plan and demonstrated an understanding of the instructions.   The patient was advised to call back or seek an in-person evaluation if the symptoms worsen or if the condition fails to improve as anticipated.  Dulce Sellar, NP [facility] [phone] (phone) [phone] (fax)  [facility]

## 2022-12-12 NOTE — Assessment & Plan Note (Addendum)
chronic, stable taking Wellbutrin 150mg  ER qd states she is doing well with this dose sending refill f/u 6 mos

## 2023-04-19 DIAGNOSIS — G478 Other sleep disorders: Secondary | ICD-10-CM | POA: Diagnosis not present

## 2023-04-19 DIAGNOSIS — C719 Malignant neoplasm of brain, unspecified: Secondary | ICD-10-CM | POA: Diagnosis not present

## 2023-04-19 DIAGNOSIS — Z79899 Other long term (current) drug therapy: Secondary | ICD-10-CM | POA: Diagnosis not present

## 2023-04-19 DIAGNOSIS — C711 Malignant neoplasm of frontal lobe: Secondary | ICD-10-CM | POA: Diagnosis not present

## 2023-04-19 DIAGNOSIS — R519 Headache, unspecified: Secondary | ICD-10-CM | POA: Diagnosis not present

## 2023-05-30 NOTE — Progress Notes (Unsigned)
Phone [phone]  Subjective:   Patient is a 42 y.o. female presenting for annual physical.    No chief complaint on file.   See problem oriented charting- ROS- full  review of systems was completed and negative except for: ***noted in HPI above.  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  Medications- reviewed and updated Current Outpatient Medications  Medication Sig Dispense Refill   buPROPion (WELLBUTRIN XL) 150 MG 24 hr tablet Take 1 tablet (150 mg total) by mouth daily. 90 tablet 1   Multiple Vitamin (MULTIVITAMIN WITH MINERALS) TABS tablet Take 1 tablet by mouth daily.     topiramate (TOPAMAX) 50 MG tablet Take 50 mg by mouth 2 (two) times daily.     No current facility-administered medications for this visit.    Allergies-reviewed and updated Allergies  Allergen Reactions   Penicillins Hives    Social History   Social History Narrative   Not on file    Objective:  There were no vitals taken for this visit. Physical Exam Vitals and nursing note reviewed.  Constitutional:      Appearance: Normal appearance.  HENT:     Head: Normocephalic.     Right Ear: Tympanic membrane normal.     Left Ear: Tympanic membrane normal.     Nose: Nose normal.     Mouth/Throat:     Mouth: Mucous membranes are moist.  Eyes:     Pupils: Pupils are equal, round, and reactive to light.  Cardiovascular:     Rate and Rhythm: Normal rate and regular rhythm.  Pulmonary:     Effort: Pulmonary  effort is normal.     Breath sounds: Normal breath sounds.  Musculoskeletal:        General: Normal range of motion.     Cervical back: Normal range of motion.  Lymphadenopathy:     Cervical: No cervical adenopathy.  Skin:    General: Skin is warm and dry.  Neurological:     Mental Status: She is alert.  Psychiatric:        Mood and Affect: Mood normal.        Behavior: Behavior normal.       Assessment and Plan   Health Maintenance counseling: 1. Anticipatory guidance: Patient counseled regarding regular dental exams q6 months, eye exams,  avoiding smoking and second hand smoke, limiting alcohol to 1 beverage per day, no illicit drugs.   2. Risk factor reduction:  Advised patient of need for regular exercise and diet rich with fruits and vegetables to reduce risk of heart attack and stroke. Exercise- ***.  Wt Readings from Last 3 Encounters:  12/12/22 150 lb (68 kg)  07/07/22 146 lb (66.2 kg)  06/06/22 148 lb 6 oz (67.3 kg)   3. Immunizations/screenings/ancillary studies Immunization History  Administered Date(s) Administered   Tdap 06/06/2014   Health Maintenance Due  Topic Date Due   HIV Screening  Never  done   Hepatitis C Screening  Never done   INFLUENZA VACCINE  Never done    4. Cervical cancer screening- 01/2022 5. Breast cancer screening-  mammogram *** 6. Colon cancer screening - *** 7. Skin cancer screening- advised regular sunscreen use. Denies worrisome, changing, or new skin lesions.  8. Birth control/STD check- *** 9. Osteoporosis screening- *** 10. Alcohol screening: *** 11. Smoking associated screening (lung cancer screening, AAA screen 65-75, UA)- *** smoker- ***ppd  Annual physical exam  Immunization due  Screening for HIV (human immunodeficiency virus)  Need for hepatitis C screening test    Recommended follow up: ***No follow-ups on file. Future Appointments  Date Time Provider Department Center  05/31/2023  1:40 PM Dulce Sellar, NP  [facility]    Lab/Order associations:fasting   Dulce Sellar, NP

## 2023-05-30 NOTE — Patient Instructions (Incomplete)
It was very nice to see you today!   I will review your lab results via MyChart in a few days.   Have a great week!    PLEASE NOTE:  If you had any lab tests please let us know if you have not heard back within a few days. You may see your results on MyChart before we have a chance to review them but we will give you a call once they are reviewed by [place]. If we ordered any referrals today, please let us know if you have not heard from their office within the next week.

## 2023-05-31 ENCOUNTER — Encounter: Payer: Self-pay | Admitting: Family

## 2023-05-31 ENCOUNTER — Ambulatory Visit (INDEPENDENT_AMBULATORY_CARE_PROVIDER_SITE_OTHER): Payer: BC Managed Care – PPO | Admitting: Family

## 2023-05-31 ENCOUNTER — Other Ambulatory Visit: Payer: BC Managed Care – PPO

## 2023-05-31 VITALS — BP 98/64 | HR 70 | Temp 98.2°F | Ht 64.0 in | Wt 148.2 lb

## 2023-05-31 DIAGNOSIS — Z0000 Encounter for general adult medical examination without abnormal findings: Secondary | ICD-10-CM

## 2023-05-31 DIAGNOSIS — F32A Depression, unspecified: Secondary | ICD-10-CM | POA: Diagnosis not present

## 2023-05-31 DIAGNOSIS — Z1231 Encounter for screening mammogram for malignant neoplasm of breast: Secondary | ICD-10-CM

## 2023-05-31 DIAGNOSIS — Z0001 Encounter for general adult medical examination with abnormal findings: Secondary | ICD-10-CM

## 2023-05-31 DIAGNOSIS — F419 Anxiety disorder, unspecified: Secondary | ICD-10-CM | POA: Diagnosis not present

## 2023-05-31 DIAGNOSIS — G4753 Recurrent isolated sleep paralysis: Secondary | ICD-10-CM | POA: Insufficient documentation

## 2023-05-31 DIAGNOSIS — Z114 Encounter for screening for human immunodeficiency virus [HIV]: Secondary | ICD-10-CM

## 2023-05-31 DIAGNOSIS — Z23 Encounter for immunization: Secondary | ICD-10-CM

## 2023-05-31 DIAGNOSIS — R748 Abnormal levels of other serum enzymes: Secondary | ICD-10-CM

## 2023-05-31 MED ORDER — BUPROPION HCL ER (XL) 150 MG PO TB24: 150.0000 mg | ORAL_TABLET | Freq: Every day | ORAL | 1 refills | Status: DC

## 2023-05-31 NOTE — Assessment & Plan Note (Signed)
chronic, Unstable taking Wellbutrin 150mg  ER qd recently dx with Sleep Paralysis - has seen Neurology and ruled out any etiology - pt feels her stress & continued lack of sleep may be etiology sending refill, will review Neuro notes & research dx, advised will let her know what I find f/u 6 mos

## 2023-06-01 LAB — LIPID PANEL
Cholesterol: 208 mg/dL — ABNORMAL HIGH (ref 0–200)
HDL: 60.1 mg/dL (ref >39.00)
LDL Cholesterol: 131 mg/dL — ABNORMAL HIGH (ref 0–99)
NonHDL: 148.01
Total CHOL/HDL Ratio: 3
Triglycerides: 86 mg/dL (ref 0.0–149.0)
VLDL: 17.2 mg/dL (ref 0.0–40.0)

## 2023-06-01 LAB — COMPREHENSIVE METABOLIC PANEL
ALT: 44 U/L — ABNORMAL HIGH (ref 0–35)
AST: 25 U/L (ref 0–37)
Albumin: 4.6 g/dL (ref 3.5–5.2)
Alkaline Phosphatase: 81 U/L (ref 39–117)
BUN: 15 mg/dL (ref 6–23)
CO2: 21 meq/L (ref 19–32)
Calcium: 9.8 mg/dL (ref 8.4–10.5)
Chloride: 106 meq/L (ref 96–112)
Creatinine, Ser: 0.72 mg/dL (ref 0.40–1.20)
GFR: 104.2 mL/min (ref >60.00)
Glucose, Bld: 88 mg/dL (ref 70–99)
Potassium: 3.8 meq/L (ref 3.5–5.1)
Sodium: 139 meq/L (ref 135–145)
Total Bilirubin: 0.4 mg/dL (ref 0.2–1.2)
Total Protein: 7.6 g/dL (ref 6.0–8.3)

## 2023-06-01 LAB — CBC WITH DIFFERENTIAL/PLATELET
Basophils Absolute: 0 10*3/uL (ref 0.0–0.1)
Basophils Relative: 0.8 % (ref 0.0–3.0)
Eosinophils Absolute: 0.2 10*3/uL (ref 0.0–0.7)
Eosinophils Relative: 3.8 % (ref 0.0–5.0)
HCT: 41.6 % (ref 36.0–46.0)
Hemoglobin: 13.7 g/dL (ref 12.0–15.0)
Lymphocytes Relative: 22.8 % (ref 12.0–46.0)
Lymphs Abs: 1.1 10*3/uL (ref 0.7–4.0)
MCHC: 32.9 g/dL (ref 30.0–36.0)
MCV: 96.6 fL (ref 78.0–100.0)
Monocytes Absolute: 0.3 10*3/uL (ref 0.1–1.0)
Monocytes Relative: 6.3 % (ref 3.0–12.0)
Neutro Abs: 3.3 10*3/uL (ref 1.4–7.7)
Neutrophils Relative %: 66.3 % (ref 43.0–77.0)
Platelets: 206 10*3/uL (ref 150.0–400.0)
RBC: 4.3 Mil/uL (ref 3.87–5.11)
RDW: 12.4 % (ref 11.5–15.5)
WBC: 4.9 10*3/uL (ref 4.0–10.5)

## 2023-06-01 LAB — TSH: TSH: 1 u[IU]/mL (ref 0.35–5.50)

## 2023-06-01 LAB — HIV ANTIBODY (ROUTINE TESTING W REFLEX): HIV 1&2 Ab, 4th Generation: NONREACTIVE

## 2023-06-06 ENCOUNTER — Other Ambulatory Visit: Payer: Self-pay | Admitting: Family

## 2023-06-06 NOTE — Addendum Note (Signed)
Addended byDulce Sellar on: 06/06/2023 06:43 PM   Modules accepted: Orders

## 2023-07-06 ENCOUNTER — Ambulatory Visit
Admission: RE | Admit: 2023-07-06 | Discharge: 2023-07-06 | Disposition: A | Discharge status: Home / Self Care | Payer: BC Managed Care – PPO | Source: Ambulatory Visit | Attending: Family | Admitting: Family

## 2023-07-06 DIAGNOSIS — Z1231 Encounter for screening mammogram for malignant neoplasm of breast: Secondary | ICD-10-CM

## 2023-07-11 ENCOUNTER — Other Ambulatory Visit: Payer: Self-pay | Admitting: Family

## 2023-07-11 DIAGNOSIS — R928 Other abnormal and inconclusive findings on diagnostic imaging of breast: Secondary | ICD-10-CM

## 2023-07-25 ENCOUNTER — Ambulatory Visit
Admission: RE | Admit: 2023-07-25 | Discharge: 2023-07-25 | Disposition: A | Discharge status: Home / Self Care | Payer: BC Managed Care – PPO | Source: Ambulatory Visit | Attending: Family | Admitting: Family

## 2023-07-25 DIAGNOSIS — R928 Other abnormal and inconclusive findings on diagnostic imaging of breast: Secondary | ICD-10-CM

## 2023-10-20 DIAGNOSIS — C711 Malignant neoplasm of frontal lobe: Secondary | ICD-10-CM | POA: Diagnosis not present

## 2023-10-20 DIAGNOSIS — C719 Malignant neoplasm of brain, unspecified: Secondary | ICD-10-CM | POA: Diagnosis not present

## 2023-12-05 ENCOUNTER — Other Ambulatory Visit: Payer: Self-pay | Admitting: Family

## 2023-12-05 DIAGNOSIS — F32A Anxiety disorder, unspecified: Secondary | ICD-10-CM

## 2024-04-19 DIAGNOSIS — C719 Malignant neoplasm of brain, unspecified: Secondary | ICD-10-CM | POA: Diagnosis not present

## 2024-06-08 ENCOUNTER — Other Ambulatory Visit: Payer: Self-pay | Admitting: Family

## 2024-06-08 DIAGNOSIS — F32A Depression, unspecified: Secondary | ICD-10-CM

## 2024-07-09 ENCOUNTER — Other Ambulatory Visit: Payer: Self-pay | Admitting: Family

## 2024-07-09 DIAGNOSIS — F32A Depression, unspecified: Secondary | ICD-10-CM
